# Patient Record
Sex: Male | Born: 1950
Health system: Southern US, Community
[De-identification: ages and names within clinical notes are randomized; demographics above are authoritative.]

## PROBLEM LIST (undated history)

## (undated) DIAGNOSIS — R7303 Prediabetes: Secondary | ICD-10-CM

## (undated) DIAGNOSIS — C439 Malignant melanoma of skin, unspecified: Secondary | ICD-10-CM

## (undated) DIAGNOSIS — N400 Enlarged prostate without lower urinary tract symptoms: Secondary | ICD-10-CM

## (undated) DIAGNOSIS — R972 Elevated prostate specific antigen [PSA]: Secondary | ICD-10-CM

## (undated) DIAGNOSIS — E785 Hyperlipidemia, unspecified: Secondary | ICD-10-CM

## (undated) DIAGNOSIS — K635 Polyp of colon: Secondary | ICD-10-CM

## (undated) HISTORY — DX: Prediabetes: R73.03

## (undated) HISTORY — DX: Benign prostatic hyperplasia without lower urinary tract symptoms: N40.0

## (undated) HISTORY — DX: Polyp of colon: K63.5

## (undated) HISTORY — DX: Hyperlipidemia, unspecified: E78.5

## (undated) HISTORY — PX: MELANOMA EXCISION: SHX5266

## (undated) HISTORY — DX: Malignant melanoma of skin, unspecified: C43.9

## (undated) HISTORY — DX: Elevated prostate specific antigen (PSA): R97.20

---

## 2016-07-17 LAB — HM COLONOSCOPY

## 2019-09-28 ENCOUNTER — Encounter: Payer: Self-pay | Admitting: Podiatry

## 2019-09-28 ENCOUNTER — Other Ambulatory Visit: Payer: Self-pay

## 2019-09-28 ENCOUNTER — Ambulatory Visit (INDEPENDENT_AMBULATORY_CARE_PROVIDER_SITE_OTHER): Payer: Medicare Other | Admitting: Podiatry

## 2019-09-28 ENCOUNTER — Ambulatory Visit (INDEPENDENT_AMBULATORY_CARE_PROVIDER_SITE_OTHER): Payer: Medicare Other

## 2019-09-28 DIAGNOSIS — M2012 Hallux valgus (acquired), left foot: Secondary | ICD-10-CM

## 2019-09-28 DIAGNOSIS — M779 Enthesopathy, unspecified: Secondary | ICD-10-CM

## 2019-09-28 DIAGNOSIS — M2011 Hallux valgus (acquired), right foot: Secondary | ICD-10-CM | POA: Diagnosis not present

## 2019-09-28 NOTE — Progress Notes (Signed)
Subjective:   Patient ID: Joel Stevens, male   DOB: 69 y.o.   MRN: [id number]   HPI Patient states he has a lot of enlargement of bone on the top of the left foot that gets sore in the joint itself seems to bother him and is not sure as to the problem.  States is been going on now for about a year and he tries to be very active and does not smoke   Review of Systems  All other systems reviewed and are negative.       Objective:  Physical Exam Vitals and nursing note reviewed.  Constitutional:      Appearance: He is well-developed.  Pulmonary:     Effort: Pulmonary effort is normal.  Musculoskeletal:        General: Normal range of motion.  Skin:    General: Skin is warm.  Neurological:     Mental Status: He is alert.     Neurovascular status found to be intact muscle strength was adequate range of motion was within normal limits.  Patient is found to have hyperostosis with enlargement around the first metatarsal head left with reduced range of motion of the joint with discomfort within the joint itself and on top of the bone spur.  Patient has good digital perfusion well oriented x3     Assessment:  Hallux limitus deformity left bone spur formation with probable beginnings of inflammation capsulitis with probable damage to the cartilage of the mild degree     Plan:  H&P condition reviewed today I did sterile prep and injected around the first MPJ 3 mg Kenalog 5 mg Xylocaine discussed shoe gear modifications and evaluation and explained to him the possibility for surgical intervention and what would be necessary in the future.  Patient will be seen back 1 month to see how he responds to medications to decide if bone spur resection or other procedure may be necessary  X-ray indicates large bone spur formation dorsal left first metatarsal with narrowing of the joint surface noted

## 2019-10-26 ENCOUNTER — Ambulatory Visit (INDEPENDENT_AMBULATORY_CARE_PROVIDER_SITE_OTHER): Payer: Medicare Other | Admitting: Podiatry

## 2019-10-26 ENCOUNTER — Other Ambulatory Visit: Payer: Self-pay

## 2019-10-26 ENCOUNTER — Encounter: Payer: Self-pay | Admitting: Podiatry

## 2019-10-26 DIAGNOSIS — M779 Enthesopathy, unspecified: Secondary | ICD-10-CM | POA: Diagnosis not present

## 2019-10-26 DIAGNOSIS — M2012 Hallux valgus (acquired), left foot: Secondary | ICD-10-CM

## 2019-10-26 DIAGNOSIS — M205X2 Other deformities of toe(s) (acquired), left foot: Secondary | ICD-10-CM | POA: Diagnosis not present

## 2019-10-26 DIAGNOSIS — M2011 Hallux valgus (acquired), right foot: Secondary | ICD-10-CM

## 2019-10-28 NOTE — Progress Notes (Signed)
Subjective:   Patient ID: Joel Stevens, male   DOB: 69 y.o.   MRN: [id number]   HPI Patient presents stating that the big toe joint has been doing much better and while he still has the spur and other problems he is very satisfied with where it is now   ROS      Objective:  Physical Exam  Neurovascular status intact with patient having hallux limitus deformity with chronic capsulitis and bone spur formation left first metatarsal     Assessment:  Hallux limitus with bone spur formation dorsal with inflamed joint surface left first MPJ improved at this time     Plan:  H&P performed and reviewed condition at great length.  Discussed different treatment options including surgical intervention bone spur removal oral anti-inflammatories or topical medications with shoe gear modifications.  He will use topical with shoe gear modifications currently and will be seen back as symptoms indicate and may need surgery in future that I educated him on today

## 2020-04-16 HISTORY — PX: PROSTATE BIOPSY: SHX241

## 2020-06-06 ENCOUNTER — Telehealth: Payer: Self-pay | Admitting: General Practice

## 2020-06-06 NOTE — Telephone Encounter (Signed)
lvm for patient to return call to get appointment scheduled from the online request

## 2020-06-08 ENCOUNTER — Encounter: Payer: Self-pay | Admitting: Podiatry

## 2020-07-04 ENCOUNTER — Ambulatory Visit: Payer: Medicare Other | Admitting: Family Medicine

## 2020-07-08 ENCOUNTER — Encounter: Payer: Self-pay | Admitting: Family Medicine

## 2020-07-08 ENCOUNTER — Other Ambulatory Visit: Payer: Self-pay

## 2020-07-08 ENCOUNTER — Ambulatory Visit: Payer: Medicare Other | Admitting: Family Medicine

## 2020-07-08 ENCOUNTER — Ambulatory Visit (INDEPENDENT_AMBULATORY_CARE_PROVIDER_SITE_OTHER): Payer: Medicare Other | Admitting: Family Medicine

## 2020-07-08 VITALS — BP 118/74 | HR 63 | Temp 98.1°F | Ht 66.5 in | Wt 204.6 lb

## 2020-07-08 DIAGNOSIS — R3915 Urgency of urination: Secondary | ICD-10-CM | POA: Diagnosis not present

## 2020-07-08 DIAGNOSIS — N401 Enlarged prostate with lower urinary tract symptoms: Secondary | ICD-10-CM | POA: Insufficient documentation

## 2020-07-08 DIAGNOSIS — R350 Frequency of micturition: Secondary | ICD-10-CM | POA: Diagnosis not present

## 2020-07-08 DIAGNOSIS — E785 Hyperlipidemia, unspecified: Secondary | ICD-10-CM | POA: Insufficient documentation

## 2020-07-08 DIAGNOSIS — N529 Male erectile dysfunction, unspecified: Secondary | ICD-10-CM | POA: Diagnosis not present

## 2020-07-08 DIAGNOSIS — R7303 Prediabetes: Secondary | ICD-10-CM | POA: Diagnosis not present

## 2020-07-08 DIAGNOSIS — Z800 Family history of malignant neoplasm of digestive organs: Secondary | ICD-10-CM

## 2020-07-08 DIAGNOSIS — R972 Elevated prostate specific antigen [PSA]: Secondary | ICD-10-CM | POA: Diagnosis not present

## 2020-07-08 DIAGNOSIS — C439 Malignant melanoma of skin, unspecified: Secondary | ICD-10-CM | POA: Insufficient documentation

## 2020-07-08 DIAGNOSIS — Z86010 Personal history of colonic polyps: Secondary | ICD-10-CM

## 2020-07-08 LAB — POCT URINALYSIS DIP (PROADVANTAGE DEVICE)
Bilirubin, UA: NEGATIVE
Blood, UA: NEGATIVE
Glucose, UA: NEGATIVE mg/dL
Ketones, POC UA: NEGATIVE mg/dL
Leukocytes, UA: NEGATIVE
Nitrite, UA: NEGATIVE
Protein Ur, POC: NEGATIVE mg/dL
Specific Gravity, Urine: 1.015
Urobilinogen, Ur: 0.2
pH, UA: 7.5 (ref 5.0–8.0)

## 2020-07-08 NOTE — Patient Instructions (Signed)
You will hear from [facility] and [facility].   We will be in touch with your lab results.    Dermatology offices  [facility]: Phone #: [phone] Address: [address], [place], [place] [address]  [facility]: Phone: [phone]  Address: [address] [place], [place] [address]  [facility]: [phone] Address: [address] [place], [place] [address]  [facility] Address: [place], [place], [place] [address] Phone: [phone]

## 2020-07-08 NOTE — Progress Notes (Signed)
   Subjective:    Patient ID: Joel Stevens, male    DOB: May 17, 1950, 70 y.o.   MRN: [id number]  HPI Chief Complaint  Patient presents with  . Establish Care   He is new to the practice and here to establish care.  Previous medical care: moved back here from [facility].    Other providers: Podiatrist- Dr. Paulla Dolly   Lost 75 lbs several years ago with diet and exercise.   HL and HTN under control.   6 month history of urinary frequency, urgency and reports a history of BPH and elevated PSA. He has not followed up with urologist since moving here from [facility].  No issues with bladder emptying.   Is not sexually active but reports having issues with ED.   Reports history of prediabetes. Would like to have this checked.   History of melanoma on chest and was supposed to follow up for skin checks q6 months but has not.   Overdue for colonoscopy due to colonic polyps. States his brother was diagnosed with colon cancer.   Denies fever, chills, dizziness, chest pain, palpitations, shortness of breath, abdominal pain, N/V/D, urinary symptoms, LE edema.    Social history: divorced, retired since 2013.  Denies smoking, drinking alcohol, drug use Diet: high in carbs. Not a lot of meat  Exercise: daily walks between 6-10 miles     Reviewed allergies, medications, past medical, surgical, family, and social history.    Review of Systems Pertinent positives and negatives in the history of present illness.     Objective:   Physical Exam BP 118/74   Pulse 63   Temp 98.1 F (36.7 C)   Ht 5' 6.5" (1.689 m)   Wt 204 lb 9.6 oz (92.8 kg)   SpO2 96%   BMI 32.53 kg/m   Alert and in no distress. Cardiac exam shows a regular sinus rhythm without murmurs or gallops. Lungs are clear to auscultation. Extremities without edema. Skin is warm and dry.        Assessment & Plan:  Benign prostatic hyperplasia with urinary frequency - Plan: POCT  Urinalysis DIP (Proadvantage Device), Ambulatory referral to Urology Urinalysis dipstick negative.  Check labs. -Referral to urology  Elevated PSA - Plan: PSA, Ambulatory referral to Urology -Recent established with [facility].  New to the area.  Urinary urgency - Plan: POCT Urinalysis DIP (Proadvantage Device), Ambulatory referral to Urology -UA negative.  Referral to urology  Erectile dysfunction, unspecified erectile dysfunction type - Plan: Ambulatory referral to Urology  Prediabetes - Plan: CBC with Differential/Platelet, Comprehensive metabolic panel, TSH, Hemoglobin A1c -Recommend low-carb, low sugar diet.  Follow-up pending A1c result  Hyperlipidemia, unspecified hyperlipidemia type - Plan: Lipid panel -He is not currently on a statin.  Follow-up pending lipid panel results   History of colon polyps - Plan: Ambulatory referral to Gastroenterology  Family history of colon cancer - Plan: Ambulatory referral to Gastroenterology  Malignant melanoma, unspecified site [facility]) -Discussed the importance of close follow-up with dermatology.  I provided him with several office recommendations

## 2020-07-09 LAB — HEMOGLOBIN A1C
Est. average glucose Bld gHb Est-mCnc: 123 mg/dL
Hgb A1c MFr Bld: 5.9 % — ABNORMAL HIGH (ref 4.8–5.6)

## 2020-07-09 LAB — LIPID PANEL
Chol/HDL Ratio: 5 ratio (ref 0.0–5.0)
Cholesterol, Total: 239 mg/dL — ABNORMAL HIGH (ref 100–199)
HDL: 48 mg/dL (ref >39)
LDL Chol Calc (NIH): 126 mg/dL — ABNORMAL HIGH (ref 0–99)
Triglycerides: 369 mg/dL — ABNORMAL HIGH (ref 0–149)
VLDL Cholesterol Cal: 65 mg/dL — ABNORMAL HIGH (ref 5–40)

## 2020-07-09 LAB — COMPREHENSIVE METABOLIC PANEL
ALT: 17 IU/L (ref 0–44)
AST: 24 IU/L (ref 0–40)
Albumin/Globulin Ratio: 1.9 (ref 1.2–2.2)
Albumin: 4.3 g/dL (ref 3.8–4.8)
Alkaline Phosphatase: 75 IU/L (ref 44–121)
BUN/Creatinine Ratio: 27 — ABNORMAL HIGH (ref 10–24)
BUN: 22 mg/dL (ref 8–27)
Bilirubin Total: 0.3 mg/dL (ref 0.0–1.2)
CO2: 23 mmol/L (ref 20–29)
Calcium: 9.1 mg/dL (ref 8.6–10.2)
Chloride: 100 mmol/L (ref 96–106)
Creatinine, Ser: 0.81 mg/dL (ref 0.76–1.27)
Globulin, Total: 2.3 g/dL (ref 1.5–4.5)
Glucose: 99 mg/dL (ref 65–99)
Potassium: 4.4 mmol/L (ref 3.5–5.2)
Sodium: 138 mmol/L (ref 134–144)
Total Protein: 6.6 g/dL (ref 6.0–8.5)
eGFR: 95 mL/min/{1.73_m2} (ref >59)

## 2020-07-09 LAB — CBC WITH DIFFERENTIAL/PLATELET
Basophils Absolute: 0.1 10*3/uL (ref 0.0–0.2)
Basos: 1 %
EOS (ABSOLUTE): 0.1 10*3/uL (ref 0.0–0.4)
Eos: 2 %
Hematocrit: 42.9 % (ref 37.5–51.0)
Hemoglobin: 14.6 g/dL (ref 13.0–17.7)
Immature Grans (Abs): 0 10*3/uL (ref 0.0–0.1)
Immature Granulocytes: 0 %
Lymphocytes Absolute: 1.4 10*3/uL (ref 0.7–3.1)
Lymphs: 21 %
MCH: 29.6 pg (ref 26.6–33.0)
MCHC: 34 g/dL (ref 31.5–35.7)
MCV: 87 fL (ref 79–97)
Monocytes Absolute: 0.6 10*3/uL (ref 0.1–0.9)
Monocytes: 9 %
Neutrophils Absolute: 4.6 10*3/uL (ref 1.4–7.0)
Neutrophils: 67 %
Platelets: 203 10*3/uL (ref 150–450)
RBC: 4.94 x10E6/uL (ref 4.14–5.80)
RDW: 13.1 % (ref 11.6–15.4)
WBC: 6.8 10*3/uL (ref 3.4–10.8)

## 2020-07-09 LAB — TSH: TSH: 2.52 u[IU]/mL (ref 0.450–4.500)

## 2020-07-09 LAB — PSA: Prostate Specific Ag, Serum: 9.1 ng/mL — ABNORMAL HIGH (ref 0.0–4.0)

## 2020-07-10 NOTE — Progress Notes (Signed)
Please check on the urology referral. I sent him a mychart message about his elevated PSA.

## 2020-07-20 ENCOUNTER — Telehealth: Payer: Self-pay | Admitting: Family Medicine

## 2020-07-20 NOTE — Telephone Encounter (Signed)
Requested records received from [facility]

## 2020-07-25 ENCOUNTER — Encounter: Payer: Self-pay | Admitting: Internal Medicine

## 2020-07-27 ENCOUNTER — Telehealth: Payer: Self-pay | Admitting: Family Medicine

## 2020-07-27 NOTE — Telephone Encounter (Signed)
Received requested records from [facility]

## 2020-08-11 ENCOUNTER — Encounter: Payer: Self-pay | Admitting: Family Medicine

## 2020-08-11 DIAGNOSIS — N401 Enlarged prostate with lower urinary tract symptoms: Secondary | ICD-10-CM | POA: Diagnosis not present

## 2020-08-11 DIAGNOSIS — N528 Other male erectile dysfunction: Secondary | ICD-10-CM | POA: Diagnosis not present

## 2020-08-11 DIAGNOSIS — R3915 Urgency of urination: Secondary | ICD-10-CM | POA: Diagnosis not present

## 2020-08-11 DIAGNOSIS — R972 Elevated prostate specific antigen [PSA]: Secondary | ICD-10-CM | POA: Diagnosis not present

## 2020-08-16 ENCOUNTER — Other Ambulatory Visit: Payer: Self-pay | Admitting: Urology

## 2020-08-16 DIAGNOSIS — R972 Elevated prostate specific antigen [PSA]: Secondary | ICD-10-CM

## 2020-08-16 DIAGNOSIS — Z8042 Family history of malignant neoplasm of prostate: Secondary | ICD-10-CM

## 2020-09-01 ENCOUNTER — Ambulatory Visit
Admission: RE | Admit: 2020-09-01 | Discharge: 2020-09-01 | Disposition: A | Discharge status: Home / Self Care | Payer: Medicare Other | Source: Ambulatory Visit | Attending: Urology | Admitting: Urology

## 2020-09-01 DIAGNOSIS — R972 Elevated prostate specific antigen [PSA]: Secondary | ICD-10-CM

## 2020-09-01 DIAGNOSIS — Z8042 Family history of malignant neoplasm of prostate: Secondary | ICD-10-CM

## 2020-09-21 NOTE — Progress Notes (Deleted)
Joel Stevens is a 70 y.o. male who presents for annual wellness visit and follow-up on chronic medical conditions.  He has the following concerns:   Immunization History  Administered Date(s) Administered  . Influenza-Unspecified 05/03/2020  . PFIZER(Purple Top)SARS-COV-2 Vaccination 06/08/2019, 06/29/2019, 01/19/2020   Last colonoscopy: Last PSA: Dentist: Ophtho: Exercise:  Other doctors caring for patient include:   Depression screen:  See questionnaire below.     Depression screen [facility] 2/9 07/08/2020  Decreased Interest 0  Down, Depressed, Hopeless 0  PHQ - 2 Score 0    Fall Screen: See Questionaire below.  No flowsheet data found.  ADL screen:  See questionnaire below.  Functional Status Survey:     End of Life Discussion:  Patient {ACTIONS; HAS/DOES NOT HAVE:19233} a living will and medical power of attorney   Review of Systems  Constitutional: -fever, -chills, -sweats, -unexpected weight change, -anorexia, -fatigue Allergy: -sneezing, -itching, -congestion Dermatology: denies changing moles, rash, lumps, new worrisome lesions ENT: -runny nose, -ear pain, -sore throat, -hoarseness, -sinus pain, -teeth pain, -tinnitus, -hearing loss, -epistaxis Cardiology:  -chest pain, -palpitations, -edema, -orthopnea, -paroxysmal nocturnal dyspnea Respiratory: -cough, -shortness of breath, -dyspnea on exertion, -wheezing, -hemoptysis Gastroenterology: -abdominal pain, -nausea, -vomiting, -diarrhea, -constipation, -blood in stool, -changes in bowel movement, -dysphagia Hematology: -bleeding or bruising problems Musculoskeletal: -arthralgias, -myalgias, -joint swelling, -back pain, -neck pain, -cramping, -gait changes Ophthalmology: -vision changes, -eye redness, -itching, -discharge Urology: -dysuria, -difficulty urinating, -hematuria, -urinary frequency, -urgency, incontinence Neurology: -headache, -weakness, -tingling, -numbness, -speech abnormality, -memory loss, -falls,  -dizziness Psychology:  -depressed mood, -agitation, -sleep problems   PHYSICAL EXAM:  There were no vitals taken for this visit.  General Appearance: Alert, cooperative, no distress, appears stated age Head: Normocephalic, without obvious abnormality, atraumatic Eyes: PERRL, conjunctiva/corneas clear, EOM's intact, fundi benign Ears: Normal TM's and external ear canals Nose: Nares normal, mucosa normal, no drainage or sinus   tenderness Throat: Lips, mucosa, and tongue normal; teeth and gums normal Neck: Supple, no lymphadenopathy, thyroid:no enlargement/tenderness/nodules; no carotid bruit or JVD Back: Spine nontender, no curvature, ROM normal, no CVA tenderness Lungs: Clear to auscultation bilaterally without wheezes, rales or ronchi; respirations unlabored Chest Wall: No tenderness or deformity Heart: Regular rate and rhythm, S1 and S2 normal, no murmur, rub or gallop Breast Exam: No chest wall tenderness, masses or gynecomastia Abdomen: Soft, non-tender, nondistended, normoactive bowel sounds, no masses, no hepatosplenomegaly Genitalia: Normal male external genitalia without lesions.  Testicles without masses.  No inguinal hernias. Rectal: Normal sphincter tone, no masses or tenderness; guaiac negative stool.  Prostate smooth, no nodules, not enlarged. Extremities: No clubbing, cyanosis or edema Pulses: 2+ and symmetric all extremities Skin: Skin color, texture, turgor normal, no rashes or lesions Lymph nodes: Cervical, supraclavicular, and axillary nodes normal Neurologic: CNII-XII intact, normal strength, sensation and gait; reflexes 2+ and symmetric throughout   Psych: Normal mood, affect, hygiene and grooming  ASSESSMENT/PLAN: Medicare annual wellness visit, subsequent     Discussed PSA screening (risks/benefits), recommended at least 30 minutes of aerobic activity at least 5 days/week; proper sunscreen use reviewed; healthy diet and alcohol recommendations (less than or  equal to 2 drinks/day) reviewed; regular seatbelt use; changing batteries in smoke detectors. Immunization recommendations discussed.  Colonoscopy recommendations reviewed.   Medicare Attestation I have personally reviewed: The patient's medical and social history Their use of alcohol, tobacco or illicit drugs Their current medications and supplements The patient's functional ability including ADLs,fall risks, home safety risks, cognitive, and hearing and visual impairment Diet and  physical activities Evidence for depression or mood disorders  The patient's weight, height, and BMI have been recorded in the chart.  I have made referrals, counseling, and provided education to the patient based on review of the above and I have provided the patient with a written personalized care plan for preventive services.     Harland Dingwall, NP-C   09/21/2020

## 2020-09-22 ENCOUNTER — Ambulatory Visit: Payer: Medicare Other | Admitting: Family Medicine

## 2020-09-30 ENCOUNTER — Encounter: Payer: Self-pay | Admitting: Family Medicine

## 2020-10-06 DIAGNOSIS — R972 Elevated prostate specific antigen [PSA]: Secondary | ICD-10-CM | POA: Diagnosis not present

## 2020-10-12 ENCOUNTER — Encounter: Payer: Self-pay | Admitting: Internal Medicine

## 2020-10-12 DIAGNOSIS — R972 Elevated prostate specific antigen [PSA]: Secondary | ICD-10-CM | POA: Diagnosis not present

## 2020-10-13 DIAGNOSIS — R972 Elevated prostate specific antigen [PSA]: Secondary | ICD-10-CM | POA: Diagnosis not present

## 2020-11-02 ENCOUNTER — Ambulatory Visit: Payer: Medicare Other | Admitting: Registered Nurse

## 2020-11-14 ENCOUNTER — Ambulatory Visit: Payer: Medicare Other | Admitting: Family Medicine

## 2021-01-04 ENCOUNTER — Encounter: Payer: Self-pay | Admitting: Registered Nurse

## 2021-01-04 ENCOUNTER — Other Ambulatory Visit: Payer: Self-pay

## 2021-01-04 ENCOUNTER — Ambulatory Visit (INDEPENDENT_AMBULATORY_CARE_PROVIDER_SITE_OTHER): Payer: Medicare Other | Admitting: Registered Nurse

## 2021-01-04 VITALS — BP 125/109 | HR 66 | Temp 98.3°F | Resp 18 | Ht 69.0 in | Wt 204.2 lb

## 2021-01-04 DIAGNOSIS — Z85820 Personal history of malignant melanoma of skin: Secondary | ICD-10-CM | POA: Diagnosis not present

## 2021-01-04 DIAGNOSIS — Z0000 Encounter for general adult medical examination without abnormal findings: Secondary | ICD-10-CM

## 2021-01-04 DIAGNOSIS — Z23 Encounter for immunization: Secondary | ICD-10-CM

## 2021-01-04 DIAGNOSIS — E669 Obesity, unspecified: Secondary | ICD-10-CM

## 2021-01-04 DIAGNOSIS — Z125 Encounter for screening for malignant neoplasm of prostate: Secondary | ICD-10-CM | POA: Diagnosis not present

## 2021-01-04 DIAGNOSIS — E785 Hyperlipidemia, unspecified: Secondary | ICD-10-CM | POA: Diagnosis not present

## 2021-01-04 DIAGNOSIS — R7303 Prediabetes: Secondary | ICD-10-CM

## 2021-01-04 DIAGNOSIS — Z1159 Encounter for screening for other viral diseases: Secondary | ICD-10-CM | POA: Diagnosis not present

## 2021-01-04 DIAGNOSIS — R972 Elevated prostate specific antigen [PSA]: Secondary | ICD-10-CM

## 2021-01-04 DIAGNOSIS — R41840 Attention and concentration deficit: Secondary | ICD-10-CM

## 2021-01-04 DIAGNOSIS — Z6830 Body mass index (BMI) 30.0-30.9, adult: Secondary | ICD-10-CM

## 2021-01-04 LAB — COMPREHENSIVE METABOLIC PANEL
ALT: 15 U/L (ref 0–53)
AST: 21 U/L (ref 0–37)
Albumin: 4.5 g/dL (ref 3.5–5.2)
Alkaline Phosphatase: 60 U/L (ref 39–117)
BUN: 18 mg/dL (ref 6–23)
CO2: 32 mEq/L (ref 19–32)
Calcium: 9.3 mg/dL (ref 8.4–10.5)
Chloride: 101 mEq/L (ref 96–112)
Creatinine, Ser: 0.83 mg/dL (ref 0.40–1.50)
GFR: 89.04 mL/min (ref >60.00)
Glucose, Bld: 95 mg/dL (ref 70–99)
Potassium: 4.1 mEq/L (ref 3.5–5.1)
Sodium: 139 mEq/L (ref 135–145)
Total Bilirubin: 0.5 mg/dL (ref 0.2–1.2)
Total Protein: 7.2 g/dL (ref 6.0–8.3)

## 2021-01-04 LAB — PSA, MEDICARE: PSA: 7.16 ng/ml — ABNORMAL HIGH (ref 0.10–4.00)

## 2021-01-04 LAB — LIPID PANEL
Cholesterol: 217 mg/dL — ABNORMAL HIGH (ref 0–200)
HDL: 63.3 mg/dL (ref >39.00)
LDL Cholesterol: 123 mg/dL — ABNORMAL HIGH (ref 0–99)
NonHDL: 153.67
Total CHOL/HDL Ratio: 3
Triglycerides: 155 mg/dL — ABNORMAL HIGH (ref 0.0–149.0)
VLDL: 31 mg/dL (ref 0.0–40.0)

## 2021-01-04 LAB — HEMOGLOBIN A1C: Hgb A1c MFr Bld: 6.1 % (ref 4.6–6.5)

## 2021-01-04 MED ORDER — PHENTERMINE HCL 37.5 MG PO CAPS: 37.5000 mg | ORAL_CAPSULE | ORAL | 1 refills | Status: DC

## 2021-01-04 NOTE — Patient Instructions (Addendum)
Mr. Joel Stevens -   Doristine Devoid to meet you.  Labs will be back this afternoon  Give me a call if you need anything  See you in 6 mo  Thank you  Rich     If you have lab work done today you will be contacted with your lab results within the next 2 weeks.  If you have not heard from [place] then please contact us. The fastest way to get your results is to register for My Chart.   IF you received an x-ray today, you will receive an invoice from [facility]. Please contact [facility] at [phone] with questions or concerns regarding your invoice.   IF you received labwork today, you will receive an invoice from [facility]. Please contact [facility] at [phone] with questions or concerns regarding your invoice.   Our billing staff will not be able to assist you with questions regarding bills from these companies.  You will be contacted with the lab results as soon as they are available. The fastest way to get your results is to activate your My Chart account. Instructions are located on the last page of this paperwork. If you have not heard from [place] regarding the results in 2 weeks, please contact this office.

## 2021-01-05 ENCOUNTER — Telehealth: Payer: Self-pay

## 2021-01-05 ENCOUNTER — Ambulatory Visit: Payer: Medicare Other | Admitting: Registered Nurse

## 2021-01-05 LAB — CBC WITH DIFFERENTIAL/PLATELET
Basophils Absolute: 0.1 10*3/uL (ref 0.0–0.1)
Basophils Relative: 1.2 % (ref 0.0–3.0)
Eosinophils Absolute: 0.1 10*3/uL (ref 0.0–0.7)
Eosinophils Relative: 2.4 % (ref 0.0–5.0)
HCT: 45.1 % (ref 39.0–52.0)
Hemoglobin: 15.1 g/dL (ref 13.0–17.0)
Lymphocytes Relative: 21.6 % (ref 12.0–46.0)
Lymphs Abs: 1.2 10*3/uL (ref 0.7–4.0)
MCHC: 33.6 g/dL (ref 30.0–36.0)
MCV: 88.3 fl (ref 78.0–100.0)
Monocytes Absolute: 0.6 10*3/uL (ref 0.1–1.0)
Monocytes Relative: 11.2 % (ref 3.0–12.0)
Neutro Abs: 3.4 10*3/uL (ref 1.4–7.7)
Neutrophils Relative %: 63.6 % (ref 43.0–77.0)
Platelets: 191 10*3/uL (ref 150.0–400.0)
RBC: 5.1 Mil/uL (ref 4.22–5.81)
RDW: 14.1 % (ref 11.5–15.5)
WBC: 5.4 10*3/uL (ref 4.0–10.5)

## 2021-01-05 LAB — HEPATITIS C ANTIBODY
Hepatitis C Ab: NONREACTIVE
SIGNAL TO CUT-OFF: 0.01 (ref <1.00)

## 2021-01-05 NOTE — Telephone Encounter (Signed)
Attempted to call patient to let him know he does not need an appointment for the referral to be sent because he was in office yesterday

## 2021-01-06 NOTE — Progress Notes (Addendum)
New Patient Office Visit  Subjective:  Patient ID: Joel Stevens, male    DOB: [birth year]  Age: 70 y.o. MRN: [id number]  CC:  Chief Complaint  Patient presents with   Annual Exam    Patient states he is here for a Physical . Patient would like to discuss colonoscopy and shingles vaccine    HPI Evern Core Lottes presents for visit to est care   Histories reviewed and updated with patient.   Bph Hx of elevated psa S/p prostate biopsy with normal findings Asymptomatic No concerns.  Colon polyps Not yet due for follow up screen No symptoms  Hyperlipidemia and prediabetes Diet and exercise controlled No acute concerns Would like routine labs.  Hx of melanoma Has been some time since seeing derm Would like referral for follow up No distinct lesions of concern at this time.  Obesity with BMI 30 Has been on phentermine in past with good effect Notes that it helps him focus, unsure the extent to which it has helped any potential underlying ADD/ADHD. Never had formal work up for this.  He has consistently lost weight - at one point had been 260+ lbs, but with phentermine, diet, and exercise, has had much healthier results.   Otherwise no concerns. Feeling well overall.   Past Medical History:  Diagnosis Date   BPH (benign prostatic hyperplasia)    Colon polyps    Elevated PSA    Hyperlipidemia    Melanoma ([place])    Prediabetes     Past Surgical History:  Procedure Laterality Date   MELANOMA EXCISION     PROSTATE BIOPSY  2022   negative    Family History  Problem Relation Age of Onset   Colon cancer Brother     Social History   Socioeconomic History   Marital status: Divorced    Spouse name: Not on file   Number of children: Not on file   Years of education: Not on file   Highest education level: Not on file  Occupational History   Not on file  Tobacco Use   Smoking status: Never   Smokeless tobacco: Never  Vaping Use   Vaping Use: Never  used  Substance and Sexual Activity   Alcohol use: Not Currently   Drug use: Never   Sexual activity: Not Currently  Other Topics Concern   Not on file  Social History Narrative   Not on file   Social Determinants of Health   Financial Resource Strain: Not on file  Food Insecurity: Not on file  Transportation Needs: Not on file  Physical Activity: Not on file  Stress: Not on file  Social Connections: Not on file  Intimate Partner Violence: Not on file    ROS Review of Systems  Constitutional: Negative.   HENT: Negative.    Eyes: Negative.   Respiratory: Negative.    Cardiovascular: Negative.   Gastrointestinal: Negative.   Genitourinary: Negative.   Musculoskeletal: Negative.   Skin: Negative.   Neurological: Negative.   Psychiatric/Behavioral: Negative.    All other systems reviewed and are negative.  Objective:   Today's Vitals: BP (!) 125/109   Pulse 66   Temp 98.3 F (36.8 C) (Temporal)   Resp 18   Ht 5\' 9"  (1.753 m)   Wt 204 lb 3.2 oz (92.6 kg)   SpO2 99%   BMI 30.16 kg/m   Physical Exam Vitals and nursing note reviewed.  Constitutional:      General: He is not in  acute distress.    Appearance: Normal appearance. He is obese. He is not ill-appearing, toxic-appearing or diaphoretic.  HENT:     Head: Normocephalic and atraumatic.     Right Ear: Tympanic membrane, ear canal and external ear normal. There is no impacted cerumen.     Left Ear: Tympanic membrane, ear canal and external ear normal. There is no impacted cerumen.     Nose: Nose normal. No congestion or rhinorrhea.     Mouth/Throat:     Mouth: Mucous membranes are moist.     Pharynx: Oropharynx is clear. No oropharyngeal exudate or posterior oropharyngeal erythema.  Eyes:     General: No scleral icterus.       Right eye: No discharge.        Left eye: No discharge.     Extraocular Movements: Extraocular movements intact.     Conjunctiva/sclera: Conjunctivae normal.     Pupils: Pupils are  equal, round, and reactive to light.  Neck:     Vascular: No carotid bruit.  Cardiovascular:     Rate and Rhythm: Normal rate and regular rhythm.     Pulses: Normal pulses.     Heart sounds: Normal heart sounds. No murmur heard.   No friction rub. No gallop.  Pulmonary:     Effort: Pulmonary effort is normal. No respiratory distress.     Breath sounds: Normal breath sounds. No stridor. No wheezing, rhonchi or rales.  Chest:     Chest wall: No tenderness.  Abdominal:     General: Abdomen is flat. Bowel sounds are normal. There is no distension.     Palpations: Abdomen is soft. There is no mass.     Tenderness: There is no abdominal tenderness. There is no right CVA tenderness, left CVA tenderness, guarding or rebound.     Hernia: No hernia is present.  Musculoskeletal:        General: No swelling, tenderness, deformity or signs of injury. Normal range of motion.     Cervical back: Normal range of motion and neck supple. No rigidity or tenderness.     Right lower leg: No edema.     Left lower leg: No edema.  Lymphadenopathy:     Cervical: No cervical adenopathy.  Skin:    General: Skin is warm and dry.     Capillary Refill: Capillary refill takes less than 2 seconds.     Coloration: Skin is not jaundiced or pale.     Findings: No bruising, erythema, lesion or rash.  Neurological:     General: No focal deficit present.     Mental Status: He is alert and oriented to person, place, and time. Mental status is at baseline.     Cranial Nerves: No cranial nerve deficit.     Sensory: No sensory deficit.     Motor: No weakness.     Coordination: Coordination normal.     Gait: Gait normal.     Deep Tendon Reflexes: Reflexes normal.  Psychiatric:        Mood and Affect: Mood normal.        Behavior: Behavior normal.        Thought Content: Thought content normal.        Judgment: Judgment normal.    Assessment & Plan:   Problem List Items Addressed This Visit       Other    Hyperlipidemia   Relevant Orders   CBC with Differential/Platelet (Completed)   Comprehensive metabolic panel (Completed)  Lipid panel (Completed)   Prediabetes   Relevant Orders   CBC with Differential/Platelet (Completed)   Comprehensive metabolic panel (Completed)   Hemoglobin A1c (Completed)   Elevated PSA   Other Visit Diagnoses     History of melanoma    -  Primary   Relevant Orders   Ambulatory referral to Dermatology   Obesity, Class I, BMI 30.0-34.9 (see actual BMI)       BMI 30.0-30.9,adult       Relevant Medications   phentermine 37.5 MG capsule   Flu vaccine need       Relevant Orders   Flu Vaccine QUAD High Dose(Fluad) (Completed)   Screening for viral disease       Relevant Orders   Hepatitis C Antibody (Completed)   Screening PSA (prostate specific antigen)       Relevant Orders   PSA, Medicare ( [facility] only) (Completed)   Concentration deficit           Outpatient Encounter Medications as of 01/04/2021  Medication Sig   Multiple Vitamin (MULTIVITAMIN) tablet Take 1 tablet by mouth daily.   phentermine 37.5 MG capsule Take 1 capsule (37.5 mg total) by mouth every morning.   finasteride (PROSCAR) 5 MG tablet Take 5 mg by mouth daily.   No facility-administered encounter medications on file as of 01/04/2021.    Follow-up: Return in about 6 months (around 07/04/2021) for hld, psa.   PLAN Labs collected. Will follow up with the patient as warranted. Restart phentermine for obesity class 1. Reviewed risks, benefits, and alternatives. Pt voices understanding. Given information for [facility]. He will seek assessment for ADD/ADHD. Refer to dermatology for hx of melanoma. No concerning lesions today but would be wise to have routine follow up with specialist. Follow up in 6 mo for check on chronic conditions, sooner with concerns Patient encouraged to call clinic with any questions, comments, or concerns.  I spent 45 minutes with  this patient discussing health maintenance, healthy lifestyle, high risk medication use, coordinating referrals, and discussing past and future lab studies.  Maximiano Coss, NP

## 2021-02-07 DIAGNOSIS — Z85820 Personal history of malignant melanoma of skin: Secondary | ICD-10-CM | POA: Diagnosis not present

## 2021-02-07 DIAGNOSIS — D229 Melanocytic nevi, unspecified: Secondary | ICD-10-CM | POA: Diagnosis not present

## 2021-02-07 DIAGNOSIS — Z1283 Encounter for screening for malignant neoplasm of skin: Secondary | ICD-10-CM | POA: Diagnosis not present

## 2021-02-07 DIAGNOSIS — Z08 Encounter for follow-up examination after completed treatment for malignant neoplasm: Secondary | ICD-10-CM | POA: Diagnosis not present

## 2021-02-07 DIAGNOSIS — D225 Melanocytic nevi of trunk: Secondary | ICD-10-CM | POA: Diagnosis not present

## 2021-04-11 ENCOUNTER — Telehealth (INDEPENDENT_AMBULATORY_CARE_PROVIDER_SITE_OTHER): Payer: Medicare Other | Admitting: Registered Nurse

## 2021-04-11 ENCOUNTER — Other Ambulatory Visit: Payer: Self-pay

## 2021-04-11 ENCOUNTER — Encounter: Payer: Self-pay | Admitting: Registered Nurse

## 2021-04-11 DIAGNOSIS — J069 Acute upper respiratory infection, unspecified: Secondary | ICD-10-CM | POA: Diagnosis not present

## 2021-04-11 NOTE — Patient Instructions (Signed)
° ° ° °  If you have lab work done today you will be contacted with your lab results within the next 2 weeks.  If you have not heard from us then please contact us. The fastest way to get your results is to register for My Chart. ° ° °IF you received an x-ray today, you will receive an invoice from [facility]. Please contact [facility] at [phone] with questions or concerns regarding your invoice.  ° °IF you received labwork today, you will receive an invoice from [facility]. Please contact [facility] at [phone] with questions or concerns regarding your invoice.  ° °Our billing staff will not be able to assist you with questions regarding bills from these companies. ° °You will be contacted with the lab results as soon as they are available. The fastest way to get your results is to activate your My Chart account. Instructions are located on the last page of this paperwork. If you have not heard from us regarding the results in 2 weeks, please contact this office. °  ° ° ° °

## 2021-04-11 NOTE — Progress Notes (Signed)
Telemedicine Encounter- SOAP NOTE Established Patient  This telephone encounter was conducted with the patient's (or proxy's) verbal consent via audio telecommunications: yes/no: Yes Patient was instructed to have this encounter in a suitably private space; and to only have persons present to whom they give permission to participate. In addition, patient identity was confirmed by use of name plus two identifiers (DOB and address).  I discussed the limitations, risks, security and privacy concerns of performing an evaluation and management service by telephone and the availability of in person appointments. I also discussed with the patient that there may be a patient responsible charge related to this service. The patient expressed understanding and agreed to proceed.  I spent a total of 14 minutes talking with the patient or their proxy.  Patient at home Provider in office  Participants: Kathrin Ruddy, NP and Gregary Signs  Chief Complaint  Patient presents with   Sore Throat    Patient is experiencing a Sore throat, coughing up phelgm, headaches, chest tightness, fatigue for about one week. Patient believes he has covid or the flu because he is feeling so bad. He is currently taking mucinex     Subjective   Joel Stevens is a 70 y.o. established patient. Telephone visit today for sore throat   HPI Onset one week ago Flu like symptoms Symptoms wax and wane.  Denies shob, doe, chest pain, palpitations, nvd  Occ lightheadedness, previously fever and chills but since resolved.  Patient Active Problem List   Diagnosis Date Noted   History of colon polyps 07/08/2020   Hyperlipidemia 07/08/2020   Prediabetes 07/08/2020   Erectile dysfunction 07/08/2020   Urinary urgency 07/08/2020   Elevated PSA 07/08/2020   Benign prostatic hyperplasia with urinary frequency 07/08/2020   Family history of colon cancer 07/08/2020   Melanoma ([place])     Past Medical History:   Diagnosis Date   BPH (benign prostatic hyperplasia)    Colon polyps    Elevated PSA    Hyperlipidemia    Melanoma ([place])    Prediabetes     Current Outpatient Medications  Medication Sig Dispense Refill   doxycycline (VIBRA-TABS) 100 MG tablet Take 1 tablet (100 mg total) by mouth 2 (two) times daily. 20 tablet 0   finasteride (PROSCAR) 5 MG tablet Take 5 mg by mouth daily.     fluticasone (FLONASE) 50 MCG/ACT nasal spray Place 2 sprays into both nostrils daily. 16 g 6   Multiple Vitamin (MULTIVITAMIN) tablet Take 1 tablet by mouth daily.     phentermine 37.5 MG capsule Take 1 capsule (37.5 mg total) by mouth every morning. 90 capsule 1   predniSONE (STERAPRED UNI-PAK 21 TAB) 10 MG (21) TBPK tablet Take per package instructions. Do not skip doses. Finish entire supply. 1 each 0   No current facility-administered medications for this visit.    No Known Allergies  Social History   Socioeconomic History   Marital status: Divorced    Spouse name: Not on file   Number of children: Not on file   Years of education: Not on file   Highest education level: Not on file  Occupational History   Not on file  Tobacco Use   Smoking status: Never   Smokeless tobacco: Never  Vaping Use   Vaping Use: Never used  Substance and Sexual Activity   Alcohol use: Not Currently   Drug use: Never   Sexual activity: Not Currently  Other Topics Concern   Not on  file  Social History Narrative   Not on file   Social Determinants of Health   Financial Resource Strain: Not on file  Food Insecurity: Not on file  Transportation Needs: Not on file  Physical Activity: Not on file  Stress: Not on file  Social Connections: Not on file  Intimate Partner Violence: Not on file    ROS Per hpi   Objective   Vitals as reported by the patient: There were no vitals filed for this visit.  Ayham was seen today for sore throat.  Diagnoses and all orders for this visit:  Upper respiratory tract  infection, unspecified type -     doxycycline (VIBRA-TABS) 100 MG tablet; Take 1 tablet (100 mg total) by mouth 2 (two) times daily. -     predniSONE (STERAPRED UNI-PAK 21 TAB) 10 MG (21) TBPK tablet; Take per package instructions. Do not skip doses. Finish entire supply. -     fluticasone (FLONASE) 50 MCG/ACT nasal spray; Place 2 sprays into both nostrils daily.    PLAN Treatment as above with doxy and prednisone Fluticasone for support Hydrate and rest Return if worsening or failing to improve Patient encouraged to call clinic with any questions, comments, or concerns.  I discussed the assessment and treatment plan with the patient. The patient was provided an opportunity to ask questions and all were answered. The patient agreed with the plan and demonstrated an understanding of the instructions.   The patient was advised to call back or seek an in-person evaluation if the symptoms worsen or if the condition fails to improve as anticipated.  I provided 14 minutes of non-face-to-face time during this encounter.  Maximiano Coss, NP

## 2021-04-12 ENCOUNTER — Encounter: Payer: Self-pay | Admitting: Registered Nurse

## 2021-04-12 MED ORDER — FLUTICASONE PROPIONATE 50 MCG/ACT NA SUSP: 2.0000 | Freq: Every day | NASAL | 6 refills | Status: DC

## 2021-04-12 MED ORDER — PREDNISONE 10 MG (21) PO TBPK: ORAL_TABLET | ORAL | 0 refills | Status: DC

## 2021-04-12 MED ORDER — DOXYCYCLINE HYCLATE 100 MG PO TABS: 100.0000 mg | ORAL_TABLET | Freq: Two times a day (BID) | ORAL | 0 refills | Status: DC

## 2021-04-12 NOTE — Telephone Encounter (Signed)
Pt called about the medications, when I told him I was going to send this the clinical pool that Joel Stevens has been in clinic all morning he hung up on me.   Please advise

## 2021-04-13 ENCOUNTER — Ambulatory Visit: Payer: Medicare Other | Admitting: Podiatry

## 2021-04-19 ENCOUNTER — Ambulatory Visit (INDEPENDENT_AMBULATORY_CARE_PROVIDER_SITE_OTHER): Payer: No Typology Code available for payment source | Admitting: Podiatry

## 2021-04-19 ENCOUNTER — Ambulatory Visit (INDEPENDENT_AMBULATORY_CARE_PROVIDER_SITE_OTHER): Payer: Self-pay

## 2021-04-19 ENCOUNTER — Ambulatory Visit (INDEPENDENT_AMBULATORY_CARE_PROVIDER_SITE_OTHER): Payer: No Typology Code available for payment source

## 2021-04-19 ENCOUNTER — Telehealth: Payer: Self-pay | Admitting: Urology

## 2021-04-19 ENCOUNTER — Other Ambulatory Visit: Payer: Self-pay

## 2021-04-19 DIAGNOSIS — M2011 Hallux valgus (acquired), right foot: Secondary | ICD-10-CM | POA: Diagnosis not present

## 2021-04-19 DIAGNOSIS — M2012 Hallux valgus (acquired), left foot: Secondary | ICD-10-CM

## 2021-04-19 DIAGNOSIS — M205X2 Other deformities of toe(s) (acquired), left foot: Secondary | ICD-10-CM

## 2021-04-19 NOTE — Telephone Encounter (Signed)
DOS - 04/25/21  MCBRIDE BUNIONECTOMY LEFT --- 38177  DEVOTED HEALTH PLANS EFFECTIVE DATE - 04/16/21  SPOKE WITH GAR T. WITH DEV[employer] PLANS AND HE STATED THAT FOR CPT CODE 11657 NO PRIOR AUTH IS REQUIRED.  REF # [place] T. 04/19/21 AT 1:58 EST

## 2021-04-19 NOTE — Progress Notes (Signed)
Subjective:   Patient ID: Joel Stevens, male   DOB: 71 y.o.   MRN: [id number]   HPI Patient states continued to have problems with big toe joint right and cannot wear shoe gear with degree of comfort.  Has tried shoe gear modification and soaks padding without relief and we have tried previous injection 6 months ago without relief   ROS      Objective:  Physical Exam  Neurovascular status intact continued spurring with prominence around the first metatarsal head left foot mild reduction of motion no crepitus of the joint      Assessment:  Enlargement of bone spur first metatarsal left that is impeding motion and creating pressure on the joint surface with shoe gear pressure     Plan:  H&P reviewed condition recommended resection of spur smoothing of joint possible osteochondral bone drilling and reviewed procedure risk and patient after extensive review signed consent form.  Understands no guarantee of success understands ultimately could require fusion joint implantation procedure and is willing to proceed and is scheduled for outpatient surgery after all questions answered.  Patient is encouraged to call questions concerns  X-rays indicate increase in the size of the spur formation first metatarsal head left no indications of complete loss of joint cartilage

## 2021-04-24 MED ORDER — HYDROCODONE-ACETAMINOPHEN 10-325 MG PO TABS: 1.0000 | ORAL_TABLET | Freq: Three times a day (TID) | ORAL | 0 refills | Status: AC | PRN

## 2021-04-24 NOTE — Addendum Note (Signed)
Addended by: Wallene Huh on: 04/24/2021 12:09 PM   Modules accepted: Orders

## 2021-04-25 ENCOUNTER — Encounter: Payer: Self-pay | Admitting: Podiatry

## 2021-04-25 DIAGNOSIS — M2022 Hallux rigidus, left foot: Secondary | ICD-10-CM | POA: Diagnosis not present

## 2021-04-25 DIAGNOSIS — M2012 Hallux valgus (acquired), left foot: Secondary | ICD-10-CM | POA: Diagnosis not present

## 2021-04-25 DIAGNOSIS — M2011 Hallux valgus (acquired), right foot: Secondary | ICD-10-CM | POA: Diagnosis not present

## 2021-05-01 ENCOUNTER — Ambulatory Visit (INDEPENDENT_AMBULATORY_CARE_PROVIDER_SITE_OTHER): Payer: No Typology Code available for payment source | Admitting: Podiatry

## 2021-05-01 ENCOUNTER — Encounter: Payer: Self-pay | Admitting: Podiatry

## 2021-05-01 ENCOUNTER — Other Ambulatory Visit: Payer: Self-pay

## 2021-05-01 ENCOUNTER — Ambulatory Visit (INDEPENDENT_AMBULATORY_CARE_PROVIDER_SITE_OTHER): Payer: No Typology Code available for payment source

## 2021-05-01 DIAGNOSIS — M205X2 Other deformities of toe(s) (acquired), left foot: Secondary | ICD-10-CM

## 2021-05-01 DIAGNOSIS — M2012 Hallux valgus (acquired), left foot: Secondary | ICD-10-CM

## 2021-05-01 DIAGNOSIS — M2011 Hallux valgus (acquired), right foot: Secondary | ICD-10-CM | POA: Diagnosis not present

## 2021-05-01 NOTE — Progress Notes (Signed)
°  Subjective:  Patient ID: Joel Stevens, male    DOB: [birth year],  MRN: [id number]  Chief Complaint  Patient presents with   Post-op Follow-up    Pt is here to follow up POV1 . Pt reports tight feeling on foot. No n/v/f/ not taking medication     DOS: 04/25/21  Procedure:Left foot Mcbride Bunionectomy.    71 y.o. male returns for POV#1. Doing well not having too much pain. Getting around well in surgical shoe   Review of Systems: Negative except as noted in the HPI. Denies N/V/F/Ch.  Past Medical History:  Diagnosis Date   BPH (benign prostatic hyperplasia)    Colon polyps    Elevated PSA    Hyperlipidemia    Melanoma ([place])    Prediabetes     Current Outpatient Medications:    doxycycline (VIBRA-TABS) 100 MG tablet, Take 1 tablet (100 mg total) by mouth 2 (two) times daily., Disp: 20 tablet, Rfl: 0   finasteride (PROSCAR) 5 MG tablet, Take 5 mg by mouth daily., Disp: , Rfl:    fluticasone (FLONASE) 50 MCG/ACT nasal spray, Place 2 sprays into both nostrils daily., Disp: 16 g, Rfl: 6   Multiple Vitamin (MULTIVITAMIN) tablet, Take 1 tablet by mouth daily., Disp: , Rfl:    phentermine 37.5 MG capsule, Take 1 capsule (37.5 mg total) by mouth every morning., Disp: 90 capsule, Rfl: 1   predniSONE (STERAPRED UNI-PAK 21 TAB) 10 MG (21) TBPK tablet, Take per package instructions. Do not skip doses. Finish entire supply., Disp: 1 each, Rfl: 0  Social History   Tobacco Use  Smoking Status Never  Smokeless Tobacco Never    No Known Allergies Objective:  There were no vitals filed for this visit. There is no height or weight on file to calculate BMI. Constitutional Well developed. Well nourished.  Vascular Foot warm and well perfused. Capillary refill normal to all digits.   Neurologic Normal speech. Oriented to person, place, and time. Epicritic sensation to light touch grossly present bilaterally.  Dermatologic Skin healing well without signs of infection. Skin edges  well coapted without signs of infection proximally. Distal aspect of insicion small area of dehiscence with granular base measuing about 0.1 cm .No signs of infection.   Orthopedic: Tenderness to palpation noted about the surgical site.   Radiographs: Good toe alignment.  Assessment:   1. Hallux limitus of left foot   2. Valgus deformity of both great toes    Plan:  Patient was evaluated and treated and all questions answered.  S/p foot surgery left -Progressing as expected post-operatively. -XR: Good toe alignment  -WB Status: WBAT in surgical shoe -Sutures: intact. Small area of dehiscence noted to distal incision. -Medications: None -Foot redressed. Follow-up in 2 weeks with Dr. Paulla Dolly   No follow-ups on file.

## 2021-05-15 ENCOUNTER — Ambulatory Visit (INDEPENDENT_AMBULATORY_CARE_PROVIDER_SITE_OTHER): Payer: No Typology Code available for payment source

## 2021-05-15 ENCOUNTER — Other Ambulatory Visit: Payer: Self-pay

## 2021-05-15 ENCOUNTER — Encounter: Payer: Self-pay | Admitting: Podiatry

## 2021-05-15 ENCOUNTER — Ambulatory Visit (INDEPENDENT_AMBULATORY_CARE_PROVIDER_SITE_OTHER): Payer: No Typology Code available for payment source | Admitting: Podiatry

## 2021-05-15 DIAGNOSIS — M205X2 Other deformities of toe(s) (acquired), left foot: Secondary | ICD-10-CM

## 2021-05-15 DIAGNOSIS — Z9889 Other specified postprocedural states: Secondary | ICD-10-CM

## 2021-05-15 NOTE — Progress Notes (Signed)
Subjective:   Patient ID: Joel Stevens, male   DOB: 71 y.o.   MRN: [id number]   HPI Patient presents stating the end of the incision on the left shows a small area but it is getting better and I am doing well with my surgery   ROS      Objective:  Physical Exam  Neurovascular status intact negative Bevelyn Buckles' sign noted wound edges well coapted very small area measuring about 3 mm x 2 mm with distal aspect left incision site that has some crusted tissue on it no erythema no edema no drainage     Assessment:  Overall doing well very minimal distal dehiscence left resolving     Plan:  Advised on bandage usage with a drop in Neosporin and let it air dry at home and still continuing open toed shoes and reviewed x-rays patient to be seen back 4 weeks or earlier if needed or if any redness drainage or pain were to occur  X-rays indicate satisfactory resection of bone good alignment noted

## 2021-05-17 HISTORY — PX: OTHER SURGICAL HISTORY: SHX169

## 2021-05-28 ENCOUNTER — Encounter: Payer: Self-pay | Admitting: Registered Nurse

## 2021-05-28 DIAGNOSIS — Z6830 Body mass index (BMI) 30.0-30.9, adult: Secondary | ICD-10-CM

## 2021-05-29 ENCOUNTER — Other Ambulatory Visit: Payer: Self-pay | Admitting: Registered Nurse

## 2021-05-29 ENCOUNTER — Other Ambulatory Visit: Payer: Self-pay

## 2021-05-29 DIAGNOSIS — Z6830 Body mass index (BMI) 30.0-30.9, adult: Secondary | ICD-10-CM

## 2021-05-29 MED ORDER — PHENTERMINE HCL 37.5 MG PO CAPS: 37.5000 mg | ORAL_CAPSULE | ORAL | 1 refills | Status: DC

## 2021-05-29 NOTE — Telephone Encounter (Signed)
° °  Patient would like the tablets instead of the capsules.    /Patient is requesting a refill of the following medications:/ Requested Prescriptions   Pending Prescriptions Disp Refills   phentermine 37.5 MG capsule 90 capsule 1    Sig: Take 1 capsule (37.5 mg total) by mouth every morning.    Date of patient request: 05/28/2021 Last office visit: 01/04/2021 Date of last refill: 01/04/2021 Last refill amount: 90 capsules 1 refill Follow up time period per chart: 07/04/2021

## 2021-05-29 NOTE — Telephone Encounter (Signed)
Pt is asking for refill phentermine, but requests pills as he breaks them in half

## 2021-05-30 ENCOUNTER — Other Ambulatory Visit: Payer: Self-pay | Admitting: Registered Nurse

## 2021-05-30 DIAGNOSIS — Z6830 Body mass index (BMI) 30.0-30.9, adult: Secondary | ICD-10-CM

## 2021-05-30 MED ORDER — PHENTERMINE HCL 37.5 MG PO TABS: 37.5000 mg | ORAL_TABLET | Freq: Every day | ORAL | 1 refills | Status: DC

## 2021-06-12 ENCOUNTER — Other Ambulatory Visit: Payer: Self-pay

## 2021-06-12 ENCOUNTER — Encounter (HOSPITAL_COMMUNITY): Payer: Self-pay | Admitting: Emergency Medicine

## 2021-06-12 ENCOUNTER — Emergency Department (HOSPITAL_COMMUNITY): Payer: No Typology Code available for payment source

## 2021-06-12 ENCOUNTER — Encounter: Payer: Self-pay | Admitting: Podiatry

## 2021-06-12 ENCOUNTER — Ambulatory Visit (INDEPENDENT_AMBULATORY_CARE_PROVIDER_SITE_OTHER): Payer: No Typology Code available for payment source | Admitting: Podiatry

## 2021-06-12 ENCOUNTER — Ambulatory Visit (INDEPENDENT_AMBULATORY_CARE_PROVIDER_SITE_OTHER): Payer: No Typology Code available for payment source

## 2021-06-12 ENCOUNTER — Emergency Department (HOSPITAL_COMMUNITY)
Admission: EM | Admit: 2021-06-12 | Discharge: 2021-06-12 | Disposition: A | Discharge status: Home / Self Care | Payer: No Typology Code available for payment source | Attending: Emergency Medicine | Admitting: Emergency Medicine

## 2021-06-12 DIAGNOSIS — Y92838 Other recreation area as the place of occurrence of the external cause: Secondary | ICD-10-CM | POA: Insufficient documentation

## 2021-06-12 DIAGNOSIS — S43004A Unspecified dislocation of right shoulder joint, initial encounter: Secondary | ICD-10-CM | POA: Insufficient documentation

## 2021-06-12 DIAGNOSIS — S43014A Anterior dislocation of right humerus, initial encounter: Secondary | ICD-10-CM | POA: Diagnosis not present

## 2021-06-12 DIAGNOSIS — S4991XA Unspecified injury of right shoulder and upper arm, initial encounter: Secondary | ICD-10-CM | POA: Diagnosis present

## 2021-06-12 DIAGNOSIS — M205X2 Other deformities of toe(s) (acquired), left foot: Secondary | ICD-10-CM | POA: Diagnosis not present

## 2021-06-12 DIAGNOSIS — W1830XA Fall on same level, unspecified, initial encounter: Secondary | ICD-10-CM | POA: Diagnosis not present

## 2021-06-12 DIAGNOSIS — Z9889 Other specified postprocedural states: Secondary | ICD-10-CM

## 2021-06-12 MED ORDER — OXYCODONE-ACETAMINOPHEN 5-325 MG PO TABS
1.0000 | ORAL_TABLET | Freq: Once | ORAL | Status: AC
  Administered 2021-06-12: 1 via ORAL
  Filled 2021-06-12: qty 1

## 2021-06-12 MED ORDER — PROPOFOL 10 MG/ML IV BOLUS
2.0000 mg/kg | Freq: Once | INTRAVENOUS | Status: AC
Start: 2021-06-12 — End: 2021-06-12
  Administered 2021-06-12: 186 mg via INTRAVENOUS
  Filled 2021-06-12: qty 20

## 2021-06-12 MED ORDER — FENTANYL CITRATE PF 50 MCG/ML IJ SOSY
50.0000 ug | PREFILLED_SYRINGE | Freq: Once | INTRAMUSCULAR | Status: AC
  Administered 2021-06-12: 50 ug via INTRAVENOUS
  Filled 2021-06-12: qty 1

## 2021-06-12 MED ORDER — ONDANSETRON HCL 4 MG/2ML IJ SOLN
4.0000 mg | Freq: Once | INTRAMUSCULAR | Status: AC
  Administered 2021-06-12: 4 mg via INTRAVENOUS
  Filled 2021-06-12: qty 2

## 2021-06-12 MED ORDER — PROPOFOL 10 MG/ML IV BOLUS
INTRAVENOUS | Status: AC | PRN
  Administered 2021-06-12: 70 ug via INTRAVENOUS

## 2021-06-12 NOTE — ED Provider Notes (Signed)
[facility] EMERGENCY DEPARTMENT Provider Note   CSN: [id number] Arrival date & time: 06/12/21  1503     History  Chief Complaint  Patient presents with   Joel Stevens is a 71 y.o. male who presents emergency department with a chief complaint of right shoulder injury.  Is a 71 year old male who was playing with his Joel Stevens today when his dog got excited and knocked him down falling onto his right shoulder.  He did not hit his head or used lose consciousness and he is not on any blood thinners.  He had immediate severe pain and was unable to move the right shoulder.  This happened approximately 3 hours prior to arrival.  The patient has not had nothing by mouth since 7 AM.   Fall      Home Medications Prior to Admission medications   Medication Sig Start Date End Date Taking? Authorizing Provider  doxycycline (VIBRA-TABS) 100 MG tablet Take 1 tablet (100 mg total) by mouth 2 (two) times daily. 04/12/21   Maximiano Coss, NP  finasteride (PROSCAR) 5 MG tablet Take 5 mg by mouth daily. 10/13/20   [provider]  fluticasone (FLONASE) 50 MCG/ACT nasal spray Place 2 sprays into both nostrils daily. 04/12/21   Maximiano Coss, NP  Multiple Vitamin (MULTIVITAMIN) tablet Take 1 tablet by mouth daily.    [provider]  phentermine (ADIPEX-P) 37.5 MG tablet Take 1 tablet (37.5 mg total) by mouth daily before breakfast. 05/30/21   Maximiano Coss, NP  predniSONE (STERAPRED UNI-PAK 21 TAB) 10 MG (21) TBPK tablet Take per package instructions. Do not skip doses. Finish entire supply. 04/12/21   Maximiano Coss, NP      Allergies    Patient has no known allergies.    Review of Systems   Review of Systems  Physical Exam Updated Vital Signs BP (!) 167/73    Pulse 60    Temp (!) 97.3 F (36.3 C) (Oral)    Resp 18    SpO2 95%  Physical Exam  ED Results / Procedures / Treatments   Labs (all labs ordered are listed, but only  abnormal results are displayed) Labs Reviewed - No data to display  EKG None  Radiology DG Shoulder Right  Result Date: 06/12/2021 CLINICAL DATA:  Right shoulder pain after fall EXAM: RIGHT SHOULDER - 2+ VIEW COMPARISON:  None. FINDINGS: There is inferior medial positioning of the humeral head on frontal view and anterior dislocation seen on transscapular Y-view. No definite Hill-Sachs or bony Bankart lesions are visualized. Normal alignment of the acromioclavicular joint. The visualized portion of the right lung is unremarkable. IMPRESSION: Anterior glenohumeral dislocation. Electronically Signed   By: Yvonne Kendall M.D.   On: 06/12/2021 16:40    Procedures Reduction of dislocation  Date/Time: 06/12/2021 11:01 PM Performed by: Margarita Mail, PA-C Authorized by: Margarita Mail, PA-C  Consent: Verbal consent obtained. Risks and benefits: risks, benefits and alternatives were discussed Consent given by: patient Patient understanding: patient states understanding of the procedure being performed Required items: required blood products, implants, devices, and special equipment available Patient identity confirmed: verbally with patient Time out: Immediately prior to procedure a "time out" was called to verify the correct patient, procedure, equipment, support staff and site/side marked as required. Preparation: Patient was prepped and draped in the usual sterile fashion.  Sedation: Patient sedated: yes (see seperate sedation note by Dr. Lavenia Atlas)  Patient tolerance: patient tolerated the procedure well with  no immediate complications      Medications Ordered in ED Medications  propofol (DIPRIVAN) 10 mg/mL bolus/IV push 186 mg (has no administration in time range)  oxyCODONE-acetaminophen (PERCOCET/ROXICET) 5-325 MG per tablet 1 tablet (1 tablet Oral Given 06/12/21 1541)    ED Course/ Medical Decision Making/ A&P Clinical Course as of 06/12/21 2301  Mon Jun 12, 2021  1700  DG Shoulder Right I visualized the right shoulder area x-ray which shows an anterior dislocation [AH]  2151 DG Shoulder Right Portable Repeat shoulder shows successful reduction of R ant shoulder dislocation [AH]    Clinical Course User Index [AH] Margarita Mail, PA-C                           Medical Decision Making Patient here with right shoulder injury.  Patient had successful anterior shoulder dislocation reduction after procedural sedation.  Placed in splint immobilizer.  Patient had some numbness in the fingers which resolved after reduction.  He is neurovascularly intact and appears appropriate for discharge at this time with outpatient follow-up  Amount and/or Complexity of Data Reviewed Radiology: ordered. Decision-making details documented in ED Course.  Risk Prescription drug management.  Final Clinical Impression(s) / ED Diagnoses Final diagnoses:  None    Rx / DC Orders ED Discharge Orders     None         Margarita Mail, PA-C 06/12/21 2303    Lorelle Gibbs, DO 06/12/21 2347

## 2021-06-12 NOTE — Progress Notes (Signed)
Subjective:   Patient ID: Joel Stevens, male   DOB: 71 y.o.   MRN: [id number]   HPI Patient presents stating he is doing well with surgery and that he is walking 3 miles a day   ROS      Objective:  Physical Exam  Neurovascular status intact with patient's left foot healing well wound edges coapted well range of motion adequate no other pathology     Assessment:  Doing well post osteotomy first metatarsal left with removal of bone spurs     Plan:  H&P reviewed condition recommended the continuation of conservative care wider shoe gear and range of motion exercises.  Patient's discharge will be seen back as needed  X-rays indicate that bone resection has occurred of a satisfactory nature good alignment noted

## 2021-06-12 NOTE — ED Triage Notes (Signed)
Patient here after being knocked to the ground by his dog and landing on his right shoulder, denies head injury. Patient is alert, oriented, and in no apparent distress at this time.

## 2021-06-12 NOTE — Progress Notes (Addendum)
Orthopedic Tech Progress Note Patient Details:  Joel Stevens 1950/10/11 [id number]  Ortho Devices Type of Ortho Device: Sling immobilizer Ortho Device/Splint Location: rue Ortho Device/Splint Interventions: Ordered, Application, Adjustment  I applied sling post reduction. Post Interventions Patient Tolerated: Well Instructions Provided: Care of device, Adjustment of device  Karolee Stamps 06/12/2021, 11:05 PM

## 2021-06-12 NOTE — Discharge Instructions (Addendum)
Contact a health care provider if: Your brace or sling gets damaged. Get help right away if: Your pain gets worse rather than better. You lose feeling in your arm or hand. Your arm or hand becomes white and cold.

## 2021-06-12 NOTE — ED Provider Triage Note (Signed)
Emergency Medicine Provider Triage Evaluation Note  Joel Stevens , a 71 y.o. male  was evaluated in triage.  Pt complains of right shoulder pain after a fall.  Patient states he was playing with his dog who is quite large and the dog ran into him knocking him over onto his right shoulder.  Review of Systems  Positive:  Negative: See above  Physical Exam  BP (!) 182/82 (BP Location: Left Arm)    Pulse (!) 58    Temp (!) 97.3 F (36.3 C) (Oral)    Resp 16    SpO2 99%  Gen:   Awake, no distress   Resp:  Normal effort  MSK:   Moves extremities without difficulty  Other:  Right shoulder is tender to palpation  Medical Decision Making  Medically screening exam initiated at 3:35 PM.  Appropriate orders placed.  Evern Core Neu was informed that the remainder of the evaluation will be completed by another provider, this initial triage assessment does not replace that evaluation, and the importance of remaining in the ED until their evaluation is complete.     Myna Bright [place], [place] 06/12/21 1538

## 2021-06-12 NOTE — ED Provider Notes (Signed)
.  Sedation  Date/Time: 06/12/2021 11:01 PM Performed by: Lorelle Gibbs, DO Authorized by: Lorelle Gibbs, DO   Consent:    Consent obtained:  Written   Consent given by:  Patient   Risks discussed:  Prolonged hypoxia resulting in organ damage, prolonged sedation necessitating reversal, inadequate sedation, allergic reaction, nausea and vomiting   Alternatives discussed:  Analgesia without sedation Universal protocol:    Immediately prior to procedure, a time out was called: yes   Indications:    Procedure performed:  Dislocation reduction   Procedure necessitating sedation performed by:  Physician performing sedation Pre-sedation assessment:    Time since last food or drink:  6   ASA classification: class 2 - patient with mild systemic disease     Mallampati score:  II - soft palate, uvula, fauces visible   Pre-sedation assessments completed and reviewed: airway patency, cardiovascular function, mental status and nausea/vomiting   Immediate pre-procedure details:    Reviewed: vital signs and relevant labs/tests     Verified: bag valve mask available, emergency equipment available, IV patency confirmed, oxygen available and suction available   Procedure details (see MAR for exact dosages):    Preoxygenation:  Nasal cannula   Sedation:  Propofol   Intended level of sedation: moderate (conscious sedation)   Analgesia:  None   Intra-procedure monitoring:  Blood pressure monitoring, continuous capnometry, frequent LOC assessments, frequent vital sign checks, continuous pulse oximetry and cardiac monitor   Intra-procedure events: none     Total Provider sedation time (minutes):  15 Post-procedure details:    Procedure completion:  Tolerated well, no immediate complications    Lorelle Gibbs, DO 06/12/21 2303

## 2021-06-23 DIAGNOSIS — S43004A Unspecified dislocation of right shoulder joint, initial encounter: Secondary | ICD-10-CM | POA: Diagnosis not present

## 2021-06-23 DIAGNOSIS — M25511 Pain in right shoulder: Secondary | ICD-10-CM | POA: Diagnosis not present

## 2021-07-04 ENCOUNTER — Ambulatory Visit (INDEPENDENT_AMBULATORY_CARE_PROVIDER_SITE_OTHER): Payer: No Typology Code available for payment source | Admitting: Registered Nurse

## 2021-07-04 ENCOUNTER — Other Ambulatory Visit: Payer: Self-pay

## 2021-07-04 ENCOUNTER — Encounter: Payer: Self-pay | Admitting: Registered Nurse

## 2021-07-04 VITALS — BP 132/83 | HR 77 | Temp 98.0°F | Resp 18 | Ht 69.0 in | Wt 208.8 lb

## 2021-07-04 DIAGNOSIS — Z6830 Body mass index (BMI) 30.0-30.9, adult: Secondary | ICD-10-CM | POA: Diagnosis not present

## 2021-07-04 DIAGNOSIS — E785 Hyperlipidemia, unspecified: Secondary | ICD-10-CM | POA: Diagnosis not present

## 2021-07-04 DIAGNOSIS — H6122 Impacted cerumen, left ear: Secondary | ICD-10-CM

## 2021-07-04 DIAGNOSIS — R7303 Prediabetes: Secondary | ICD-10-CM

## 2021-07-04 LAB — CBC WITH DIFFERENTIAL/PLATELET
Basophils Absolute: 0.1 10*3/uL (ref 0.0–0.1)
Basophils Relative: 1.1 % (ref 0.0–3.0)
Eosinophils Absolute: 0.1 10*3/uL (ref 0.0–0.7)
Eosinophils Relative: 2.1 % (ref 0.0–5.0)
HCT: 44 % (ref 39.0–52.0)
Hemoglobin: 14.6 g/dL (ref 13.0–17.0)
Lymphocytes Relative: 18.8 % (ref 12.0–46.0)
Lymphs Abs: 1.2 10*3/uL (ref 0.7–4.0)
MCHC: 33.3 g/dL (ref 30.0–36.0)
MCV: 88 fl (ref 78.0–100.0)
Monocytes Absolute: 0.6 10*3/uL (ref 0.1–1.0)
Monocytes Relative: 9.2 % (ref 3.0–12.0)
Neutro Abs: 4.4 10*3/uL (ref 1.4–7.7)
Neutrophils Relative %: 68.8 % (ref 43.0–77.0)
Platelets: 266 10*3/uL (ref 150.0–400.0)
RBC: 5 Mil/uL (ref 4.22–5.81)
RDW: 14.1 % (ref 11.5–15.5)
WBC: 6.4 10*3/uL (ref 4.0–10.5)

## 2021-07-04 LAB — LDL CHOLESTEROL, DIRECT: Direct LDL: 195 mg/dL

## 2021-07-04 LAB — COMPREHENSIVE METABOLIC PANEL
ALT: 33 U/L (ref 0–53)
AST: 25 U/L (ref 0–37)
Albumin: 4.5 g/dL (ref 3.5–5.2)
Alkaline Phosphatase: 66 U/L (ref 39–117)
BUN: 18 mg/dL (ref 6–23)
CO2: 32 mEq/L (ref 19–32)
Calcium: 9.5 mg/dL (ref 8.4–10.5)
Chloride: 98 mEq/L (ref 96–112)
Creatinine, Ser: 1 mg/dL (ref 0.40–1.50)
GFR: 76.3 mL/min (ref >60.00)
Glucose, Bld: 122 mg/dL — ABNORMAL HIGH (ref 70–99)
Potassium: 4.2 mEq/L (ref 3.5–5.1)
Sodium: 137 mEq/L (ref 135–145)
Total Bilirubin: 0.6 mg/dL (ref 0.2–1.2)
Total Protein: 6.8 g/dL (ref 6.0–8.3)

## 2021-07-04 LAB — LIPID PANEL
Cholesterol: 289 mg/dL — ABNORMAL HIGH (ref 0–200)
HDL: 66.5 mg/dL (ref >39.00)
NonHDL: 222.81
Total CHOL/HDL Ratio: 4
Triglycerides: 261 mg/dL — ABNORMAL HIGH (ref 0.0–149.0)
VLDL: 52.2 mg/dL — ABNORMAL HIGH (ref 0.0–40.0)

## 2021-07-04 LAB — HEMOGLOBIN A1C: Hgb A1c MFr Bld: 6.3 % (ref 4.6–6.5)

## 2021-07-04 NOTE — Patient Instructions (Addendum)
Mr. Orsak -  ? ?Great to see you ? ?Let's recheck labs ? ?We'll hesitate to make too many changes as I'm sure when you're back to your usual exercise, sugars and cholesterol will look even better. ? ?See you in 6 mo, we can extend to 1 year if labs are really good. ? ? ?Thanks, ? ?Rich  ? ? ? ?If you have lab work done today you will be contacted with your lab results within the next 2 weeks.  If you have not heard from [place] then please contact us. The fastest way to get your results is to register for My Chart. ? ? ?IF you received an x-ray today, you will receive an invoice from [facility]. Please contact [facility] at [phone] with questions or concerns regarding your invoice.  ? ?IF you received labwork today, you will receive an invoice from [place] [facility]. Please contact [facility] at [phone] with questions or concerns regarding your invoice.  ? ?Our billing staff will not be able to assist you with questions regarding bills from these companies. ? ?You will be contacted with the lab results as soon as they are available. The fastest way to get your results is to activate your My Chart account. Instructions are located on the last page of this paperwork. If you have not heard from [place] regarding the results in 2 weeks, please contact this office. ?  ? ? ?

## 2021-07-04 NOTE — Progress Notes (Signed)
? ?Established Patient Office Visit ? ?Subjective:  ?Patient ID: Joel Stevens, male    DOB: February 18, 1951  Age: 71 y.o. MRN: [id number] ? ?CC:  ?Chief Complaint  ?Patient presents with  ? Follow-up  ?  Patient states he is here for his 6 month follow up. Patient states he would like to have his left ear looked at because it keeps popping and having pain.  ? ? ?HPI ?[name] presents for 6 mo follow up  ? ?Prediabetes ?Lab Results  ?Component Value Date  ? HGBA1C 6.1 01/04/2021  ?No medications. Diet and exercise. ?Has been limited as he had bone spurs removed in foot. ?Weight has gone up as a result. ? ?Hld ?Stable. ?Diet and exercise ?Lab Results  ?Component Value Date  ? CHOL 217 (H) 01/04/2021  ? HDL 63.30 01/04/2021  ? LDLCALC 123 (H) 01/04/2021  ? TRIG 155.0 (H) 01/04/2021  ? CHOLHDL 3 01/04/2021  ? ?Ear pressure ?L side, on and off popping ?No changes to hearing ?Denies drainage, local adenopathy ?Hx of impacted cerumen, feels similar ? ? ? ?Past Medical History:  ?Diagnosis Date  ? BPH (benign prostatic hyperplasia)   ? Colon polyps   ? Elevated PSA   ? Hyperlipidemia   ? Melanoma ([place])   ? Prediabetes   ? ? ?Past Surgical History:  ?Procedure Laterality Date  ? MELANOMA EXCISION    ? PROSTATE BIOPSY  2022  ? negative  ? ? ?Family History  ?Problem Relation Age of Onset  ? Colon cancer Brother   ? ? ?Social History  ? ?Socioeconomic History  ? Marital status: Divorced  ?  Spouse name: Not on file  ? Number of children: Not on file  ? Years of education: Not on file  ? Highest education level: Not on file  ?Occupational History  ? Not on file  ?Tobacco Use  ? Smoking status: Never  ? Smokeless tobacco: Never  ?Vaping Use  ? Vaping Use: Never used  ?Substance and Sexual Activity  ? Alcohol use: Not Currently  ? Drug use: Never  ? Sexual activity: Not Currently  ?Other Topics Concern  ? Not on file  ?Social History Narrative  ? Not on file  ? ?Social Determinants of Health  ? ?Financial Resource  Strain: Not on file  ?Food Insecurity: Not on file  ?Transportation Needs: Not on file  ?Physical Activity: Not on file  ?Stress: Not on file  ?Social Connections: Not on file  ?Intimate Partner Violence: Not on file  ? ? ?Outpatient Medications Prior to Visit  ?Medication Sig Dispense Refill  ? finasteride (PROSCAR) 5 MG tablet Take 5 mg by mouth daily.    ? fluticasone (FLONASE) 50 MCG/ACT nasal spray Place 2 sprays into both nostrils daily. 16 g 6  ? Multiple Vitamin (MULTIVITAMIN) tablet Take 1 tablet by mouth daily.    ? phentermine (ADIPEX-P) 37.5 MG tablet Take 1 tablet (37.5 mg total) by mouth daily before breakfast. 90 tablet 1  ? doxycycline (VIBRA-TABS) 100 MG tablet Take 1 tablet (100 mg total) by mouth 2 (two) times daily. (Patient not taking: Reported on 07/04/2021) 20 tablet 0  ? predniSONE (STERAPRED UNI-PAK 21 TAB) 10 MG (21) TBPK tablet Take per package instructions. Do not skip doses. Finish entire supply. (Patient not taking: Reported on 07/04/2021) 1 each 0  ? ?No facility-administered medications prior to visit.  ? ? ?No Known Allergies ? ?ROS ?Review of Systems  ?Constitutional: Negative.   ?HENT: Negative.    ?  Eyes: Negative.   ?Respiratory: Negative.    ?Cardiovascular: Negative.   ?Gastrointestinal: Negative.   ?Genitourinary: Negative.   ?Musculoskeletal: Negative.   ?Skin: Negative.   ?Neurological: Negative.   ?Psychiatric/Behavioral: Negative.    ?All other systems reviewed and are negative. ? ?  ?Objective:  ?  ?Physical Exam ?Constitutional:   ?   General: He is not in acute distress. ?   Appearance: Normal appearance. He is normal weight. He is not ill-appearing, toxic-appearing or diaphoretic.  ?HENT:  ?   Right Ear: Tympanic membrane, ear canal and external ear normal. There is no impacted cerumen.  ?   Left Ear: Tympanic membrane, ear canal and external ear normal. There is impacted cerumen.  ?Cardiovascular:  ?   Rate and Rhythm: Normal rate and regular rhythm.  ?   Heart sounds:  Normal heart sounds. No murmur heard. ?  No friction rub. No gallop.  ?Pulmonary:  ?   Effort: Pulmonary effort is normal. No respiratory distress.  ?   Breath sounds: Normal breath sounds. No stridor. No wheezing, rhonchi or rales.  ?Chest:  ?   Chest wall: No tenderness.  ?Neurological:  ?   General: No focal deficit present.  ?   Mental Status: He is alert and oriented to person, place, and time. Mental status is at baseline.  ?Psychiatric:     ?   Mood and Affect: Mood normal.     ?   Behavior: Behavior normal.     ?   Thought Content: Thought content normal.     ?   Judgment: Judgment normal.  ? ?Procedure ?Discussed ear lavage and instrumentation risks, benefits, AE. ?Pt voices understanding ?Ears lavaged with warm water ?Moderate amount of tan cerumen removed ?TM visualized ?No instrumentation needed ?Pt tolerated well ? ?BP 132/83   Pulse 77   Temp 98 ?F (36.7 ?C) (Temporal)   Resp 18   Ht 5' 9"  (1.753 m)   Wt 208 lb 12.8 oz (94.7 kg)   SpO2 99%   BMI 30.83 kg/m?  ?Wt Readings from Last 3 Encounters:  ?07/04/21 208 lb 12.8 oz (94.7 kg)  ?06/12/21 190 lb (86.2 kg)  ?01/04/21 204 lb 3.2 oz (92.6 kg)  ? ? ? ?Health Maintenance Due  ?Topic Date Due  ? Zoster Vaccines- Shingrix (1 of 2) Never done  ? Pneumonia Vaccine 9+ Years old (1 - PCV) Never done  ? COVID-19 Vaccine (4 - Booster for Pfizer series) 03/15/2020  ? ? ?There are no preventive care reminders to display for this patient. ? ?Lab Results  ?Component Value Date  ? TSH 2.520 07/08/2020  ? ?Lab Results  ?Component Value Date  ? WBC 5.4 01/04/2021  ? HGB 15.1 01/04/2021  ? HCT 45.1 01/04/2021  ? MCV 88.3 01/04/2021  ? PLT 191.0 01/04/2021  ? ?Lab Results  ?Component Value Date  ? NA 139 01/04/2021  ? K 4.1 01/04/2021  ? CO2 32 01/04/2021  ? GLUCOSE 95 01/04/2021  ? BUN 18 01/04/2021  ? CREATININE 0.83 01/04/2021  ? BILITOT 0.5 01/04/2021  ? ALKPHOS 60 01/04/2021  ? AST 21 01/04/2021  ? ALT 15 01/04/2021  ? PROT 7.2 01/04/2021  ? ALBUMIN 4.5  01/04/2021  ? CALCIUM 9.3 01/04/2021  ? EGFR 95 07/08/2020  ? GFR 89.04 01/04/2021  ? ?Lab Results  ?Component Value Date  ? CHOL 217 (H) 01/04/2021  ? ?Lab Results  ?Component Value Date  ? HDL 63.30 01/04/2021  ? ?Lab Results  ?  Component Value Date  ? LDLCALC 123 (H) 01/04/2021  ? ?Lab Results  ?Component Value Date  ? TRIG 155.0 (H) 01/04/2021  ? ?Lab Results  ?Component Value Date  ? CHOLHDL 3 01/04/2021  ? ?Lab Results  ?Component Value Date  ? HGBA1C 6.1 01/04/2021  ? ? ?  ?Assessment & Plan:  ? ?Problem List Items Addressed This Visit   ? ?  ? Other  ? Hyperlipidemia  ? Relevant Orders  ? CBC with Differential/Platelet  ? Comprehensive metabolic panel  ? Hemoglobin A1c  ? Lipid panel  ? Prediabetes  ? Relevant Orders  ? CBC with Differential/Platelet  ? Comprehensive metabolic panel  ? Hemoglobin A1c  ? Lipid panel  ? ?Other Visit Diagnoses   ? ? BMI 30.0-30.9,adult    -  Primary  ? Relevant Orders  ? CBC with Differential/Platelet  ? Comprehensive metabolic panel  ? Hemoglobin A1c  ? Lipid panel  ? Impacted cerumen, left ear      ? ?  ? ? ?No orders of the defined types were placed in this encounter. ? ? ?Follow-up: Return in about 6 months (around 01/04/2022) for prediabtes, hld.  ? ?PLAN ?Impacted cerumen resolved. Pt tolerated lavage well ?Labs collected. Will follow up with the patient as warranted. ?Encourage healthy lifestyle. ?He will follow up with ortho and podiatry as scheduled. ?Return in 6 mo, sooner with concerns ?Patient encouraged to call clinic with any questions, comments, or concerns. ? ?Maximiano Coss, NP ?

## 2021-07-05 ENCOUNTER — Encounter: Payer: Self-pay | Admitting: Registered Nurse

## 2021-07-05 DIAGNOSIS — S43004A Unspecified dislocation of right shoulder joint, initial encounter: Secondary | ICD-10-CM | POA: Diagnosis not present

## 2021-07-18 DIAGNOSIS — S43004A Unspecified dislocation of right shoulder joint, initial encounter: Secondary | ICD-10-CM | POA: Diagnosis not present

## 2021-07-18 DIAGNOSIS — M25511 Pain in right shoulder: Secondary | ICD-10-CM | POA: Diagnosis not present

## 2021-07-20 DIAGNOSIS — R972 Elevated prostate specific antigen [PSA]: Secondary | ICD-10-CM | POA: Diagnosis not present

## 2021-07-21 DIAGNOSIS — S43004A Unspecified dislocation of right shoulder joint, initial encounter: Secondary | ICD-10-CM | POA: Diagnosis not present

## 2021-07-21 DIAGNOSIS — M25511 Pain in right shoulder: Secondary | ICD-10-CM | POA: Diagnosis not present

## 2021-07-25 DIAGNOSIS — M25511 Pain in right shoulder: Secondary | ICD-10-CM | POA: Diagnosis not present

## 2021-07-25 DIAGNOSIS — S43004A Unspecified dislocation of right shoulder joint, initial encounter: Secondary | ICD-10-CM | POA: Diagnosis not present

## 2021-07-27 DIAGNOSIS — S43004A Unspecified dislocation of right shoulder joint, initial encounter: Secondary | ICD-10-CM | POA: Diagnosis not present

## 2021-07-27 DIAGNOSIS — M25511 Pain in right shoulder: Secondary | ICD-10-CM | POA: Diagnosis not present

## 2021-08-01 DIAGNOSIS — S43004A Unspecified dislocation of right shoulder joint, initial encounter: Secondary | ICD-10-CM | POA: Diagnosis not present

## 2021-08-01 DIAGNOSIS — M25511 Pain in right shoulder: Secondary | ICD-10-CM | POA: Diagnosis not present

## 2021-08-03 DIAGNOSIS — S43004A Unspecified dislocation of right shoulder joint, initial encounter: Secondary | ICD-10-CM | POA: Diagnosis not present

## 2021-08-03 DIAGNOSIS — M25511 Pain in right shoulder: Secondary | ICD-10-CM | POA: Diagnosis not present

## 2021-08-31 ENCOUNTER — Ambulatory Visit (INDEPENDENT_AMBULATORY_CARE_PROVIDER_SITE_OTHER): Payer: No Typology Code available for payment source

## 2021-08-31 DIAGNOSIS — Z0000 Encounter for general adult medical examination without abnormal findings: Secondary | ICD-10-CM

## 2021-08-31 DIAGNOSIS — Z1211 Encounter for screening for malignant neoplasm of colon: Secondary | ICD-10-CM

## 2021-08-31 DIAGNOSIS — N400 Enlarged prostate without lower urinary tract symptoms: Secondary | ICD-10-CM | POA: Insufficient documentation

## 2021-08-31 NOTE — Patient Instructions (Signed)
Joel Stevens , Thank you for taking time to come for your Medicare Wellness Visit. I appreciate your ongoing commitment to your health goals. Please review the following plan we discussed and let me know if I can assist you in the future.   Screening recommendations/referrals: Colonoscopy: done 07/17/16. A referral has been sent to [facility] today. They will contact you for an appointment.  Recommended yearly ophthalmology/optometry visit for glaucoma screening and checkup Recommended yearly dental visit for hygiene and checkup  Vaccinations: Influenza vaccine: done 01/04/21 Pneumococcal vaccine: due Tdap vaccine: due Shingles vaccine: Shingrix discussed. Please contact your pharmacy for coverage information.  Covid-19: done 06/08/19, 06/29/19, 01/19/20  Advanced directives: Please bring a copy of your health care power of attorney and living will to the office at your convenience.   Conditions/risks identified: Keep up the great work!  Next appointment: Follow up in one year for your annual wellness visit.   Preventive Care 6 Years and Older, Male Preventive care refers to lifestyle choices and visits with your health care provider that can promote health and wellness. What does preventive care include? A yearly physical exam. This is also called an annual well check. Dental exams once or twice a year. Routine eye exams. Ask your health care provider how often you should have your eyes checked. Personal lifestyle choices, including: Daily care of your teeth and gums. Regular physical activity. Eating a healthy diet. Avoiding tobacco and drug use. Limiting alcohol use. Practicing safe sex. Taking low doses of aspirin every day. Taking vitamin and mineral supplements as recommended by your health care provider. What happens during an annual well check? The services and screenings done by your health care provider during your annual well check will depend on your age,  overall health, lifestyle risk factors, and family history of disease. Counseling  Your health care provider may ask you questions about your: Alcohol use. Tobacco use. Drug use. Emotional well-being. Home and relationship well-being. Sexual activity. Eating habits. History of falls. Memory and ability to understand (cognition). Work and work Statistician. Screening  You may have the following tests or measurements: Height, weight, and BMI. Blood pressure. Lipid and cholesterol levels. These may be checked every 5 years, or more frequently if you are over 71 years old. Skin check. Lung cancer screening. You may have this screening every year starting at age 68 if you have a 30-pack-year history of smoking and currently smoke or have quit within the past 15 years. Fecal occult blood test (FOBT) of the stool. You may have this test every year starting at age 23. Flexible sigmoidoscopy or colonoscopy. You may have a sigmoidoscopy every 5 years or a colonoscopy every 10 years starting at age 21. Prostate cancer screening. Recommendations will vary depending on your family history and other risks. Hepatitis C blood test. Hepatitis B blood test. Sexually transmitted disease (STD) testing. Diabetes screening. This is done by checking your blood sugar (glucose) after you have not eaten for a while (fasting). You may have this done every 1-3 years. Abdominal aortic aneurysm (AAA) screening. You may need this if you are a current or former smoker. Osteoporosis. You may be screened starting at age 50 if you are at high risk. Talk with your health care provider about your test results, treatment options, and if necessary, the need for more tests. Vaccines  Your health care provider may recommend certain vaccines, such as: Influenza vaccine. This is recommended every year. Tetanus, diphtheria, and acellular pertussis (Tdap, Td) vaccine.  You may need a Td booster every 10 years. Zoster vaccine.  You may need this after age 26. Pneumococcal 13-valent conjugate (PCV13) vaccine. One dose is recommended after age 15. Pneumococcal polysaccharide (PPSV23) vaccine. One dose is recommended after age 64. Talk to your health care provider about which screenings and vaccines you need and how often you need them. This information is not intended to replace advice given to you by your health care provider. Make sure you discuss any questions you have with your health care provider. Document Released: 04/29/2015 Document Revised: 12/21/2015 Document Reviewed: 02/01/2015 Elsevier Interactive Patient Education  2017 [place] Prevention in the Home Falls can cause injuries. They can happen to people of all ages. There are many things you can do to make your home safe and to help prevent falls. What can I do on the outside of my home? Regularly fix the edges of walkways and driveways and fix any cracks. Remove anything that might make you trip as you walk through a door, such as a raised step or threshold. Trim any bushes or trees on the path to your home. Use bright outdoor lighting. Clear any walking paths of anything that might make someone trip, such as rocks or tools. Regularly check to see if handrails are loose or broken. Make sure that both sides of any steps have handrails. Any raised decks and porches should have guardrails on the edges. Have any leaves, snow, or ice cleared regularly. Use sand or salt on walking paths during winter. Clean up any spills in your garage right away. This includes oil or grease spills. What can I do in the bathroom? Use night lights. Install grab bars by the toilet and in the tub and shower. Do not use towel bars as grab bars. Use non-skid mats or decals in the tub or shower. If you need to sit down in the shower, use a plastic, non-slip stool. Keep the floor dry. Clean up any water that spills on the floor as soon as it happens. Remove soap  buildup in the tub or shower regularly. Attach bath mats securely with double-sided non-slip rug tape. Do not have throw rugs and other things on the floor that can make you trip. What can I do in the bedroom? Use night lights. Make sure that you have a light by your bed that is easy to reach. Do not use any sheets or blankets that are too big for your bed. They should not hang down onto the floor. Have a firm chair that has side arms. You can use this for support while you get dressed. Do not have throw rugs and other things on the floor that can make you trip. What can I do in the kitchen? Clean up any spills right away. Avoid walking on wet floors. Keep items that you use a lot in easy-to-reach places. If you need to reach something above you, use a strong step stool that has a grab bar. Keep electrical cords out of the way. Do not use floor polish or wax that makes floors slippery. If you must use wax, use non-skid floor wax. Do not have throw rugs and other things on the floor that can make you trip. What can I do with my stairs? Do not leave any items on the stairs. Make sure that there are handrails on both sides of the stairs and use them. Fix handrails that are broken or loose. Make sure that handrails are as long as  the stairways. Check any carpeting to make sure that it is firmly attached to the stairs. Fix any carpet that is loose or worn. Avoid having throw rugs at the top or bottom of the stairs. If you do have throw rugs, attach them to the floor with carpet tape. Make sure that you have a light switch at the top of the stairs and the bottom of the stairs. If you do not have them, ask someone to add them for you. What else can I do to help prevent falls? Wear shoes that: Do not have high heels. Have rubber bottoms. Are comfortable and fit you well. Are closed at the toe. Do not wear sandals. If you use a stepladder: Make sure that it is fully opened. Do not climb a closed  stepladder. Make sure that both sides of the stepladder are locked into place. Ask someone to hold it for you, if possible. Clearly mark and make sure that you can see: Any grab bars or handrails. First and last steps. Where the edge of each step is. Use tools that help you move around (mobility aids) if they are needed. These include: Canes. Walkers. Scooters. Crutches. Turn on the lights when you go into a dark area. Replace any light bulbs as soon as they burn out. Set up your furniture so you have a clear path. Avoid moving your furniture around. If any of your floors are uneven, fix them. If there are any pets around you, be aware of where they are. Review your medicines with your doctor. Some medicines can make you feel dizzy. This can increase your chance of falling. Ask your doctor what other things that you can do to help prevent falls. This information is not intended to replace advice given to you by your health care provider. Make sure you discuss any questions you have with your health care provider. Document Released: 01/27/2009 Document Revised: 09/08/2015 Document Reviewed: 05/07/2014 Elsevier Interactive Patient Education  2017 [employer].

## 2021-08-31 NOTE — Progress Notes (Signed)
Subjective:   Joel Stevens is a 71 y.o. male who presents for Medicare Annual/Subsequent preventive examination.  Virtual Visit via Telephone Note  I connected with  Joel Stevens on 08/31/21 at 11:30 AM EDT by telephone and verified that I am speaking with the correct person using two identifiers.  Location: Patient: home Provider: office Persons participating in the virtual visit: patient/Nurse Health Advisor   I discussed the limitations, risks, security and privacy concerns of performing an evaluation and management service by telephone and the availability of in person appointments. The patient expressed understanding and agreed to proceed.  Interactive audio and video telecommunications were attempted between this nurse and patient, however failed, due to patient having technical difficulties OR patient did not have access to video capability.  We continued and completed visit with audio only.  Some vital signs may be absent or patient reported.   [name], LPN   Review of Systems     Cardiac Risk Factors include: advanced age (>62mn, >>12women)     Objective:    There were no vitals filed for this visit. There is no height or weight on file to calculate BMI.     08/31/2021   11:56 AM  Advanced Directives  Does Patient Have a Medical Advance Directive? Yes  Type of Advance Directive [place] Chart? No - copy requested    Current Medications (verified) Outpatient Encounter Medications as of 08/31/2021  Medication Sig   finasteride (PROSCAR) 5 MG tablet Take 5 mg by mouth daily.   Multiple Vitamin (MULTIVITAMIN) tablet Take 1 tablet by mouth daily.   phentermine (ADIPEX-P) 37.5 MG tablet Take 1 tablet (37.5 mg total) by mouth daily before breakfast.   [DISCONTINUED] finasteride (PROSCAR) 5 MG tablet Take 5 mg by mouth daily.   [DISCONTINUED] fluticasone (FLONASE) 50 MCG/ACT nasal spray  Place 2 sprays into both nostrils daily.   No facility-administered encounter medications on file as of 08/31/2021.    Allergies (verified) Patient has no known allergies.   History: Past Medical History:  Diagnosis Date   BPH (benign prostatic hyperplasia)    Colon polyps    Elevated PSA    Hyperlipidemia    Melanoma ([facility]    Prediabetes    Past Surgical History:  Procedure Laterality Date   bone spur removal Left 05/2021   foot   MELANOMA EXCISION     PROSTATE BIOPSY  2022   negative   Family History  Problem Relation Age of Onset   Colon cancer Brother    Cancer - Colon Brother    Social History   Socioeconomic History   Marital status: Divorced    Spouse name: Not on file   Number of children: Not on file   Years of education: Not on file   Highest education level: Not on file  Occupational History   Not on file  Tobacco Use   Smoking status: Never   Smokeless tobacco: Never  Vaping Use   Vaping Use: Never used  Substance and Sexual Activity   Alcohol use: Not Currently   Drug use: Never   Sexual activity: Not Currently  Other Topics Concern   Not on file  Social History Narrative   Pt lives alone   Social Determinants of Health   Financial Resource Strain: Low Risk    Difficulty of Paying Living Expenses: Not hard at all  Food Insecurity: No Food Insecurity  Worried About Charity fundraiser in the Last Year: Never true   [place] in the Last Year: Never true  Transportation Needs: No Transportation Needs   Lack of Transportation (Medical): No   Lack of Transportation (Non-Medical): No  Physical Activity: Sufficiently Active   Days of Exercise per Week: 7 days   Minutes of Exercise per Session: 90 min  Stress: No Stress Concern Present   Feeling of Stress : Not at all  Social Connections: Socially Isolated   Frequency of Communication with Friends and Family: More than three times a week   Frequency of Social Gatherings with Friends  and Family: Twice a week   Attends Religious Services: Never   Marine scientist or Organizations: No   Attends Music therapist: Never   Marital Status: Divorced    Tobacco Counseling Counseling given: Not Answered   Clinical Intake:  Pre-visit preparation completed: Yes  Pain : No/denies pain     Nutritional Risks: None Diabetes: No  How often do you need to have someone help you when you read instructions, pamphlets, or other written materials from your doctor or pharmacy?: 1 - Never    Interpreter Needed?: No  Information entered by :: [name] LPN   Activities of Daily Living    08/31/2021   11:59 AM 04/11/2021   12:06 PM  In your present state of health, do you have any difficulty performing the following activities:  Hearing? 1 0  Vision? 0 0  Difficulty concentrating or making decisions? 0 0  Walking or climbing stairs? 0 0  Dressing or bathing? 0 0  Doing errands, shopping? 0 0  Preparing Food and eating ? N   Using the Toilet? N   In the past six months, have you accidently leaked urine? N   Do you have problems with loss of bowel control? N   Managing your Medications? N   Managing your Finances? N   Housekeeping or managing your Housekeeping? N     Patient Care Team: Maximiano Coss, NP as PCP - General (Adult Health Nurse Practitioner) Ceasar Mons, MD as Consulting Physician (Urology)  Indicate any recent Medical Services you may have received from other than [facility] providers in the past year (date may be approximate).     Assessment:   This is a routine wellness examination for Joel Stevens.  Hearing/Vision screen Hearing Screening - Comments:: Pt c/o mild hearing difficulty; specifically with left ear Vision Screening - Comments:: Past due for eye exam; established with Dr. Marica Otter  Dietary issues and exercise activities discussed: Current Exercise Habits: Home exercise routine, Type of exercise: walking,  Time (Minutes): > 60, Frequency (Times/Week): 7, Weekly Exercise (Minutes/Week): 0, Intensity: Moderate, Exercise limited by: None identified   Goals Addressed   None    Depression Screen    08/31/2021   11:55 AM 07/04/2021    8:46 AM 04/11/2021   11:59 AM 07/08/2020    1:23 PM  PHQ 2/9 Scores  PHQ - 2 Score 0 0 0 0  PHQ- 9 Score  0 0     Fall Risk    08/31/2021   11:58 AM 07/04/2021    8:45 AM 04/11/2021   11:59 AM 01/04/2021    8:59 AM  Fall Risk   Falls in the past year? 1 1 0 0  Number falls in past yr: 0 0 0 0  Injury with Fall? 1 1 0 0  Risk for fall  due to : No Fall Risks No Fall Risks No Fall Risks No Fall Risks  Follow up Falls prevention discussed Falls evaluation completed Falls evaluation completed Falls evaluation completed    [place]:  Any stairs in or around the home? Yes  If so, are there any without handrails? No  Home free of loose throw rugs in walkways, pet beds, electrical cords, etc? Yes  Adequate lighting in your home to reduce risk of falls? Yes   ASSISTIVE DEVICES UTILIZED TO PREVENT FALLS:  Life alert? No  Use of a cane, walker or w/c? No  Grab bars in the bathroom? Yes  Shower chair or bench in shower? No  Elevated toilet seat or a handicapped toilet? No   TIMED UP AND GO:  Was the test performed? No . Telephonic visit.   Cognitive Function: Normal cognitive status assessed by direct observation by this Nurse Health Advisor. No abnormalities found.          Immunizations Immunization History  Administered Date(s) Administered   Fluad Quad(high Dose 65+) 01/04/2021   Influenza-Unspecified 05/03/2020   PFIZER(Purple Top)SARS-COV-2 Vaccination 06/08/2019, 06/29/2019, 01/19/2020    TDAP status: Due, Education has been provided regarding the importance of this vaccine. Advised may receive this vaccine at local pharmacy or Health Dept. Aware to provide a copy of the vaccination record if obtained from  local pharmacy or Health Dept. Verbalized acceptance and understanding.  Flu Vaccine status: Up to date  Pneumococcal vaccine status: Due, Education has been provided regarding the importance of this vaccine. Advised may receive this vaccine at local pharmacy or Health Dept. Aware to provide a copy of the vaccination record if obtained from local pharmacy or Health Dept. Verbalized acceptance and understanding.  Covid-19 vaccine status: Completed vaccines  Qualifies for Shingles Vaccine? Yes   Zostavax completed No   Shingrix Completed?: No.    Education has been provided regarding the importance of this vaccine. Patient has been advised to call insurance company to determine out of pocket expense if they have not yet received this vaccine. Advised may also receive vaccine at local pharmacy or Health Dept. Verbalized acceptance and understanding.  Screening Tests Health Maintenance  Topic Date Due   Zoster Vaccines- Shingrix (1 of 2) Never done   Pneumonia Vaccine 45+ Years old (1 - PCV) Never done   COVID-19 Vaccine (4 - Booster for Pfizer series) 03/15/2020   COLONOSCOPY (Pts 45-19yr Insurance coverage will need to be confirmed)  07/17/2021   TETANUS/TDAP  01/04/2022 (Originally 01/30/1970)   INFLUENZA VACCINE  11/14/2021   Hepatitis C Screening  Completed   HPV VACCINES  Aged Out    Health Maintenance  Health Maintenance Due  Topic Date Due   Zoster Vaccines- Shingrix (1 of 2) Never done   Pneumonia Vaccine 71 Years old (1 - PCV) Never done   COVID-19 Vaccine (4 - Booster for PMillstonseries) 03/15/2020   COLONOSCOPY (Pts 45-447yrInsurance coverage will need to be confirmed)  07/17/2021    Colorectal cancer screening: Type of screening: Colonoscopy. Completed 07/17/16. Repeat every 5 years  Lung Cancer Screening: (Low Dose CT Chest recommended if Age 71-80ears, 30 pack-year currently smoking OR have quit w/in 15years.) does not qualify.   Additional Screening:  Hepatitis  C Screening: does qualify; Completed 01/04/21  Vision Screening: Recommended annual ophthalmology exams for early detection of glaucoma and other disorders of the eye. Is the patient up to date with their annual eye exam?  Yes  Who is the provider or what is the name of the office in which the patient attends annual eye exams? Dr. Marica Otter.   Dental Screening: Recommended annual dental exams for proper oral hygiene  Community Resource Referral / Chronic Care Management: CRR required this visit?  No   CCM required this visit?  No      Plan:     I have personally reviewed and noted the following in the patient's chart:   Medical and social history Use of alcohol, tobacco or illicit drugs  Current medications and supplements including opioid prescriptions. Patient is not currently taking opioid prescriptions. Functional ability and status Nutritional status Physical activity Advanced directives List of other physicians Hospitalizations, surgeries, and ER visits in previous 12 months Vitals Screenings to include cognitive, depression, and falls Referrals and appointments  In addition, I have reviewed and discussed with patient certain preventive protocols, quality metrics, and best practice recommendations. A written personalized care plan for preventive services as well as general preventive health recommendations were provided to patient.     [name], LPN   11/23/9831   Nurse Notes: none

## 2021-11-09 ENCOUNTER — Encounter: Payer: Self-pay | Admitting: Registered Nurse

## 2021-11-16 ENCOUNTER — Other Ambulatory Visit: Payer: Self-pay | Admitting: Registered Nurse

## 2021-11-16 DIAGNOSIS — Z6830 Body mass index (BMI) 30.0-30.9, adult: Secondary | ICD-10-CM

## 2021-11-16 MED ORDER — PHENTERMINE HCL 37.5 MG PO TABS: 37.5000 mg | ORAL_TABLET | Freq: Every day | ORAL | 0 refills | Status: DC

## 2021-12-29 DIAGNOSIS — Z008 Encounter for other general examination: Secondary | ICD-10-CM | POA: Diagnosis not present

## 2021-12-29 DIAGNOSIS — E663 Overweight: Secondary | ICD-10-CM | POA: Diagnosis not present

## 2021-12-29 DIAGNOSIS — Z6827 Body mass index (BMI) 27.0-27.9, adult: Secondary | ICD-10-CM | POA: Diagnosis not present

## 2021-12-29 DIAGNOSIS — Z85820 Personal history of malignant melanoma of skin: Secondary | ICD-10-CM | POA: Diagnosis not present

## 2022-01-04 ENCOUNTER — Ambulatory Visit: Payer: No Typology Code available for payment source | Admitting: Registered Nurse

## 2022-06-14 IMAGING — CR DG SHOULDER 2+V*R*
3 series · 3 of 3 positions shown · non-contrast
Comparison: None.

CLINICAL DATA: Right shoulder pain after fall

EXAM:
RIGHT SHOULDER - 2+ VIEW

[shoulder grashey]
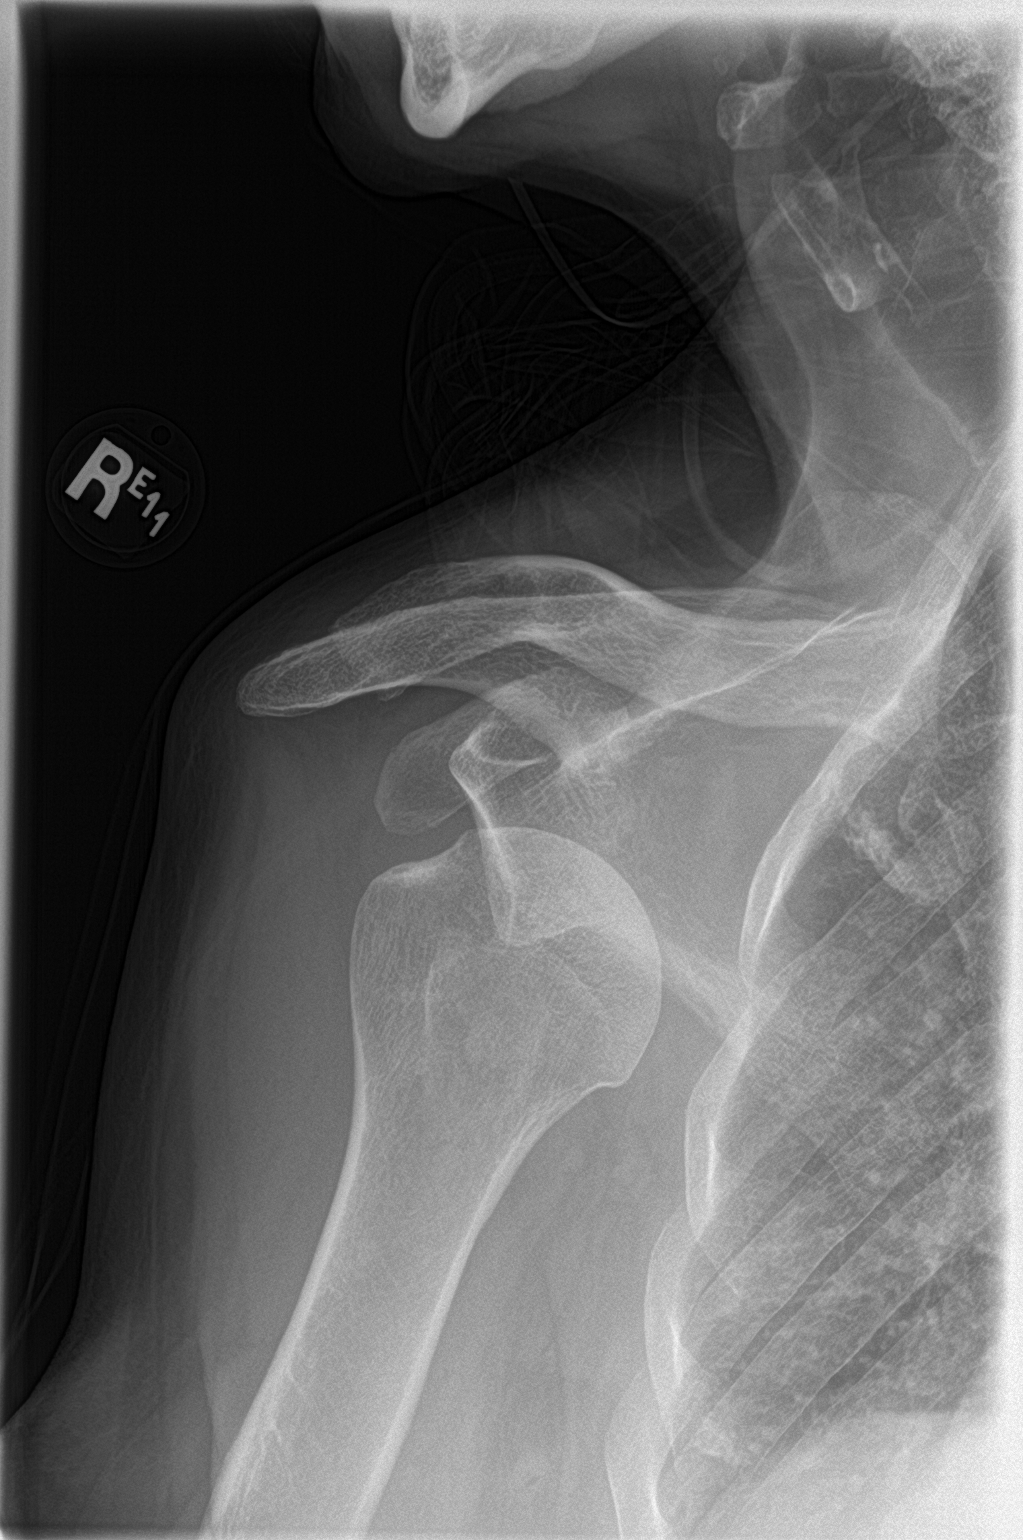

[shoulder ap neutral]
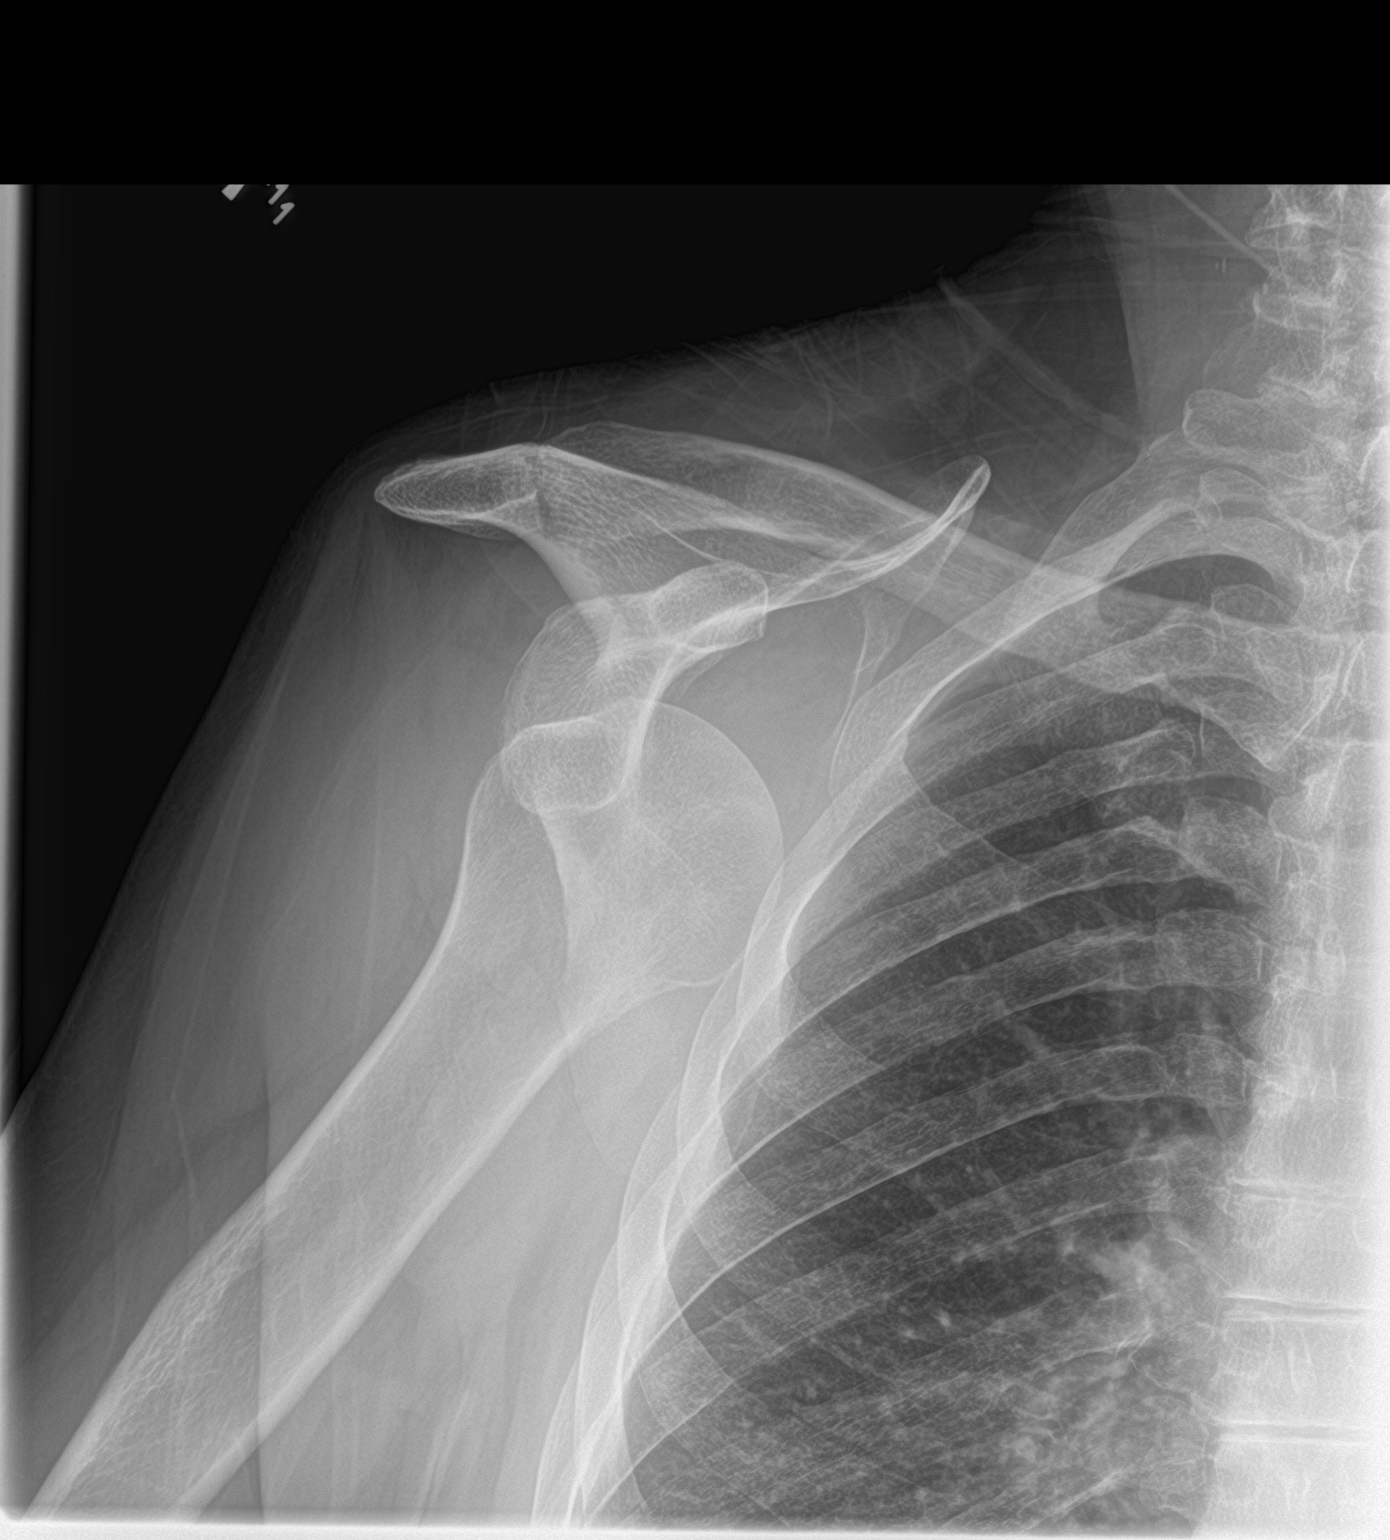

[shoulder y view]
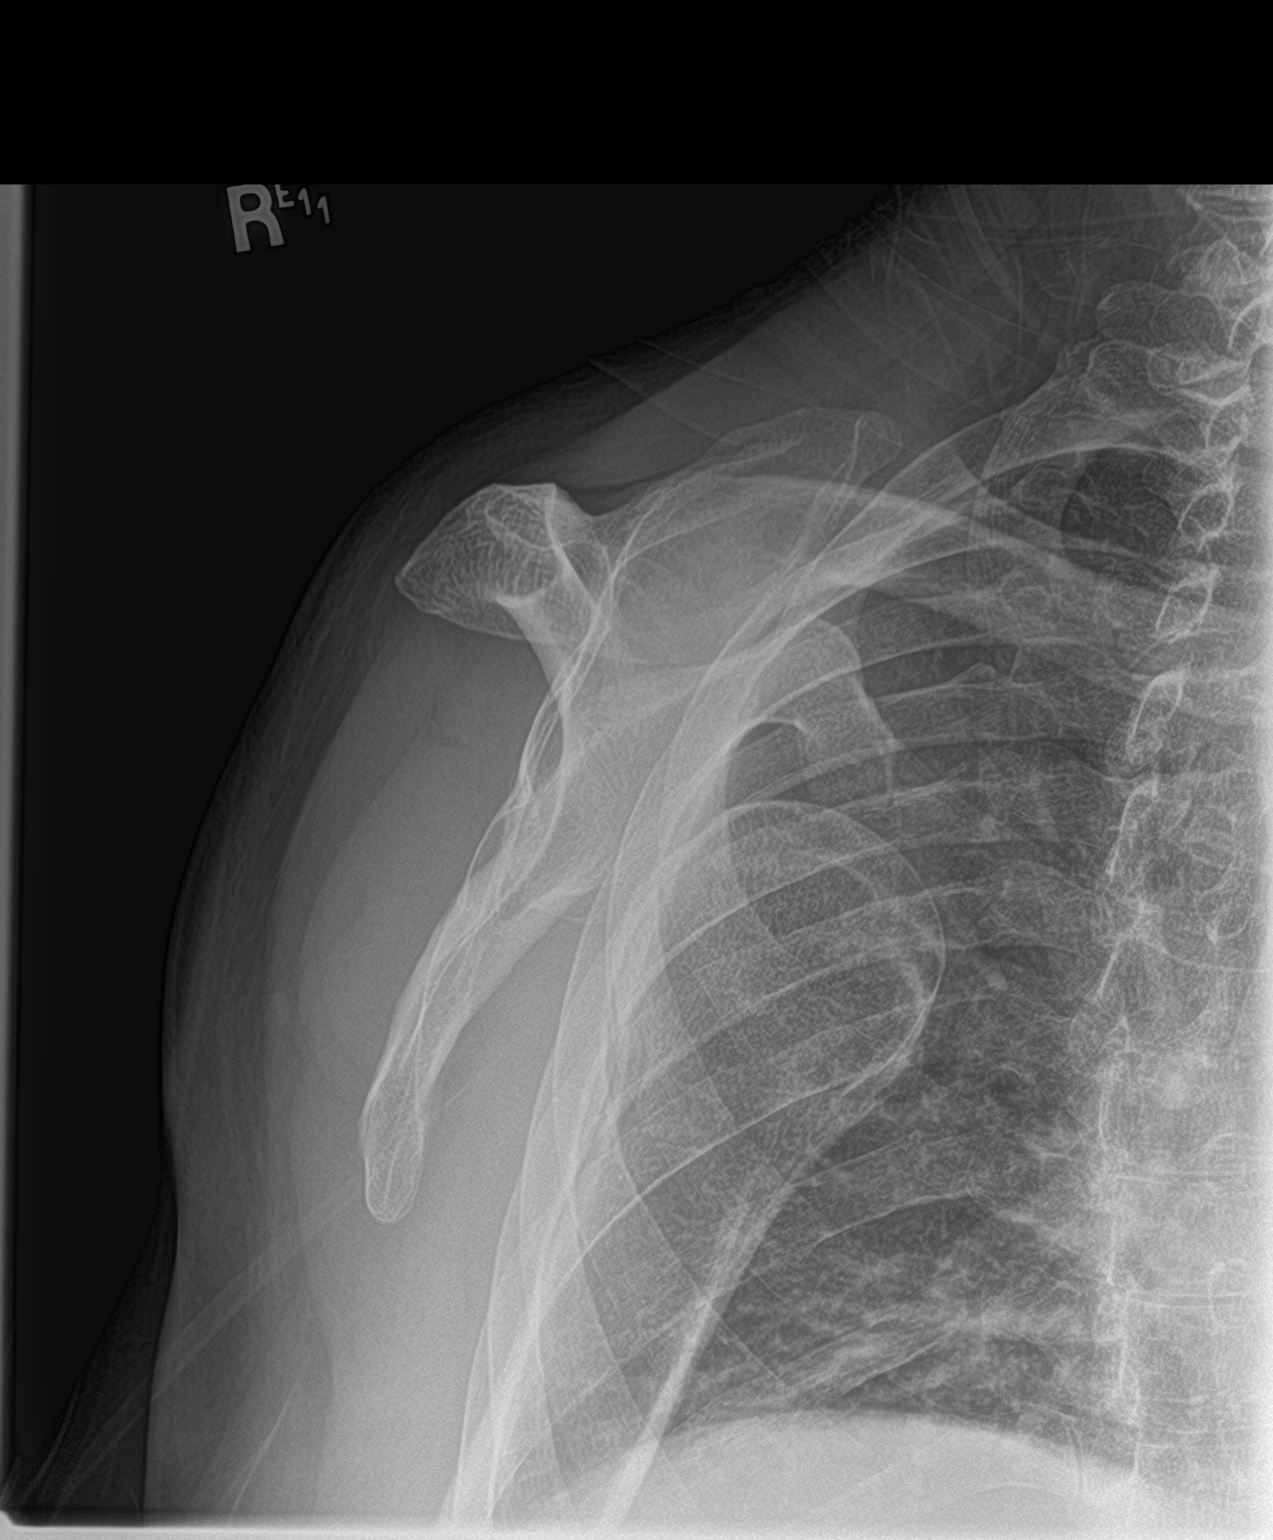

[3 of 3 positions shown; findings below may reference images not displayed]

FINDINGS: There is inferior medial positioning of the humeral head on frontal
view and anterior dislocation seen on transscapular Y-view. No
definite Hill-Sachs or bony Bankart lesions are visualized. Normal
alignment of the acromioclavicular joint. The visualized portion of
the right lung is unremarkable.
IMPRESSION: Anterior glenohumeral dislocation.

## 2022-07-30 DIAGNOSIS — R42 Dizziness and giddiness: Secondary | ICD-10-CM | POA: Diagnosis not present

## 2022-07-30 DIAGNOSIS — G4700 Insomnia, unspecified: Secondary | ICD-10-CM | POA: Diagnosis not present

## 2022-07-30 DIAGNOSIS — N400 Enlarged prostate without lower urinary tract symptoms: Secondary | ICD-10-CM | POA: Diagnosis not present

## 2022-07-30 DIAGNOSIS — R030 Elevated blood-pressure reading, without diagnosis of hypertension: Secondary | ICD-10-CM | POA: Diagnosis not present

## 2022-07-30 DIAGNOSIS — C439 Malignant melanoma of skin, unspecified: Secondary | ICD-10-CM | POA: Diagnosis not present

## 2022-07-30 DIAGNOSIS — R7303 Prediabetes: Secondary | ICD-10-CM | POA: Diagnosis not present

## 2022-07-30 DIAGNOSIS — R972 Elevated prostate specific antigen [PSA]: Secondary | ICD-10-CM | POA: Diagnosis not present

## 2022-07-30 DIAGNOSIS — Z7185 Encounter for immunization safety counseling: Secondary | ICD-10-CM | POA: Diagnosis not present

## 2022-07-30 DIAGNOSIS — K635 Polyp of colon: Secondary | ICD-10-CM | POA: Diagnosis not present

## 2022-09-03 DIAGNOSIS — R030 Elevated blood-pressure reading, without diagnosis of hypertension: Secondary | ICD-10-CM | POA: Diagnosis not present

## 2022-09-03 DIAGNOSIS — R739 Hyperglycemia, unspecified: Secondary | ICD-10-CM | POA: Diagnosis not present

## 2022-09-03 DIAGNOSIS — R972 Elevated prostate specific antigen [PSA]: Secondary | ICD-10-CM | POA: Diagnosis not present

## 2022-09-03 DIAGNOSIS — E559 Vitamin D deficiency, unspecified: Secondary | ICD-10-CM | POA: Diagnosis not present

## 2022-09-03 DIAGNOSIS — Z23 Encounter for immunization: Secondary | ICD-10-CM | POA: Diagnosis not present

## 2022-09-03 DIAGNOSIS — Z789 Other specified health status: Secondary | ICD-10-CM | POA: Diagnosis not present

## 2022-09-03 DIAGNOSIS — R7303 Prediabetes: Secondary | ICD-10-CM | POA: Diagnosis not present

## 2022-09-03 DIAGNOSIS — N400 Enlarged prostate without lower urinary tract symptoms: Secondary | ICD-10-CM | POA: Diagnosis not present

## 2022-09-21 DIAGNOSIS — Z08 Encounter for follow-up examination after completed treatment for malignant neoplasm: Secondary | ICD-10-CM | POA: Diagnosis not present

## 2022-09-21 DIAGNOSIS — Z1283 Encounter for screening for malignant neoplasm of skin: Secondary | ICD-10-CM | POA: Diagnosis not present

## 2022-09-21 DIAGNOSIS — Z85820 Personal history of malignant melanoma of skin: Secondary | ICD-10-CM | POA: Diagnosis not present

## 2022-09-21 DIAGNOSIS — D225 Melanocytic nevi of trunk: Secondary | ICD-10-CM | POA: Diagnosis not present

## 2022-09-21 DIAGNOSIS — D485 Neoplasm of uncertain behavior of skin: Secondary | ICD-10-CM | POA: Diagnosis not present

## 2022-10-08 DIAGNOSIS — Z1331 Encounter for screening for depression: Secondary | ICD-10-CM | POA: Diagnosis not present

## 2022-10-08 DIAGNOSIS — N400 Enlarged prostate without lower urinary tract symptoms: Secondary | ICD-10-CM | POA: Diagnosis not present

## 2022-10-08 DIAGNOSIS — Z23 Encounter for immunization: Secondary | ICD-10-CM | POA: Diagnosis not present

## 2022-10-08 DIAGNOSIS — E785 Hyperlipidemia, unspecified: Secondary | ICD-10-CM | POA: Diagnosis not present

## 2022-10-08 DIAGNOSIS — Z1339 Encounter for screening examination for other mental health and behavioral disorders: Secondary | ICD-10-CM | POA: Diagnosis not present

## 2022-10-08 DIAGNOSIS — I10 Essential (primary) hypertension: Secondary | ICD-10-CM | POA: Diagnosis not present

## 2022-10-08 DIAGNOSIS — Z0000 Encounter for general adult medical examination without abnormal findings: Secondary | ICD-10-CM | POA: Diagnosis not present

## 2022-10-08 DIAGNOSIS — R972 Elevated prostate specific antigen [PSA]: Secondary | ICD-10-CM | POA: Diagnosis not present

## 2022-12-03 DIAGNOSIS — E785 Hyperlipidemia, unspecified: Secondary | ICD-10-CM | POA: Diagnosis not present

## 2022-12-03 DIAGNOSIS — R972 Elevated prostate specific antigen [PSA]: Secondary | ICD-10-CM | POA: Diagnosis not present

## 2022-12-03 DIAGNOSIS — N400 Enlarged prostate without lower urinary tract symptoms: Secondary | ICD-10-CM | POA: Diagnosis not present

## 2022-12-03 DIAGNOSIS — I10 Essential (primary) hypertension: Secondary | ICD-10-CM | POA: Diagnosis not present

## 2022-12-10 DIAGNOSIS — N529 Male erectile dysfunction, unspecified: Secondary | ICD-10-CM | POA: Diagnosis not present

## 2022-12-10 DIAGNOSIS — R972 Elevated prostate specific antigen [PSA]: Secondary | ICD-10-CM | POA: Diagnosis not present

## 2022-12-10 DIAGNOSIS — I10 Essential (primary) hypertension: Secondary | ICD-10-CM | POA: Diagnosis not present

## 2022-12-10 DIAGNOSIS — E782 Mixed hyperlipidemia: Secondary | ICD-10-CM | POA: Diagnosis not present

## 2022-12-10 DIAGNOSIS — Z789 Other specified health status: Secondary | ICD-10-CM | POA: Diagnosis not present

## 2022-12-10 DIAGNOSIS — N400 Enlarged prostate without lower urinary tract symptoms: Secondary | ICD-10-CM | POA: Diagnosis not present

## 2022-12-10 DIAGNOSIS — Z23 Encounter for immunization: Secondary | ICD-10-CM | POA: Diagnosis not present

## 2023-02-21 DIAGNOSIS — N401 Enlarged prostate with lower urinary tract symptoms: Secondary | ICD-10-CM | POA: Diagnosis not present

## 2023-02-21 DIAGNOSIS — R972 Elevated prostate specific antigen [PSA]: Secondary | ICD-10-CM | POA: Diagnosis not present

## 2023-02-21 DIAGNOSIS — N528 Other male erectile dysfunction: Secondary | ICD-10-CM | POA: Diagnosis not present

## 2023-02-21 DIAGNOSIS — R3915 Urgency of urination: Secondary | ICD-10-CM | POA: Diagnosis not present

## 2023-03-28 DIAGNOSIS — H5202 Hypermetropia, left eye: Secondary | ICD-10-CM | POA: Diagnosis not present

## 2023-03-28 DIAGNOSIS — Z0100 Encounter for examination of eyes and vision without abnormal findings: Secondary | ICD-10-CM | POA: Diagnosis not present

## 2023-05-08 DIAGNOSIS — I10 Essential (primary) hypertension: Secondary | ICD-10-CM | POA: Diagnosis not present

## 2023-05-08 DIAGNOSIS — R5383 Other fatigue: Secondary | ICD-10-CM | POA: Diagnosis not present

## 2023-05-08 DIAGNOSIS — R739 Hyperglycemia, unspecified: Secondary | ICD-10-CM | POA: Diagnosis not present

## 2023-05-08 DIAGNOSIS — E785 Hyperlipidemia, unspecified: Secondary | ICD-10-CM | POA: Diagnosis not present

## 2023-05-20 DIAGNOSIS — R7303 Prediabetes: Secondary | ICD-10-CM | POA: Diagnosis not present

## 2023-05-20 DIAGNOSIS — N400 Enlarged prostate without lower urinary tract symptoms: Secondary | ICD-10-CM | POA: Diagnosis not present

## 2023-05-20 DIAGNOSIS — H6590 Unspecified nonsuppurative otitis media, unspecified ear: Secondary | ICD-10-CM | POA: Diagnosis not present

## 2023-05-20 DIAGNOSIS — K635 Polyp of colon: Secondary | ICD-10-CM | POA: Diagnosis not present

## 2023-05-20 DIAGNOSIS — R972 Elevated prostate specific antigen [PSA]: Secondary | ICD-10-CM | POA: Diagnosis not present

## 2023-07-10 ENCOUNTER — Other Ambulatory Visit: Payer: Self-pay | Admitting: Medical Genetics

## 2023-08-19 DIAGNOSIS — R972 Elevated prostate specific antigen [PSA]: Secondary | ICD-10-CM | POA: Diagnosis not present

## 2023-08-29 ENCOUNTER — Telehealth: Payer: Self-pay | Admitting: Gastroenterology

## 2023-08-29 NOTE — Telephone Encounter (Signed)
 Good morning Dr. Cherryl Corona,   DOD AM 08/29/23  We received a referral for patient to have a colonoscopy. Patient last had a colonoscopy in 2018 in [place] . Patient now lives locally and wishing to continue care with [facility]. Patient previous records are in [facility] for you to review and advise on scheduling.   Thank you.

## 2023-09-06 DIAGNOSIS — I10 Essential (primary) hypertension: Secondary | ICD-10-CM | POA: Diagnosis not present

## 2023-09-06 DIAGNOSIS — R635 Abnormal weight gain: Secondary | ICD-10-CM | POA: Diagnosis not present

## 2023-09-06 DIAGNOSIS — H6590 Unspecified nonsuppurative otitis media, unspecified ear: Secondary | ICD-10-CM | POA: Diagnosis not present

## 2023-09-06 DIAGNOSIS — H9201 Otalgia, right ear: Secondary | ICD-10-CM | POA: Diagnosis not present

## 2023-09-06 DIAGNOSIS — H7290 Unspecified perforation of tympanic membrane, unspecified ear: Secondary | ICD-10-CM | POA: Diagnosis not present

## 2023-09-10 ENCOUNTER — Encounter: Payer: Self-pay | Admitting: Gastroenterology

## 2023-09-10 NOTE — Telephone Encounter (Signed)
 Left voicemail for patient to call back to discuss scheduling colonoscopy

## 2023-09-25 DIAGNOSIS — R42 Dizziness and giddiness: Secondary | ICD-10-CM | POA: Diagnosis not present

## 2023-09-25 DIAGNOSIS — H9221 Otorrhagia, right ear: Secondary | ICD-10-CM | POA: Diagnosis not present

## 2023-09-26 ENCOUNTER — Other Ambulatory Visit: Payer: Self-pay

## 2023-09-27 DIAGNOSIS — E559 Vitamin D deficiency, unspecified: Secondary | ICD-10-CM | POA: Diagnosis not present

## 2023-09-27 DIAGNOSIS — G4700 Insomnia, unspecified: Secondary | ICD-10-CM | POA: Diagnosis not present

## 2023-09-27 DIAGNOSIS — F331 Major depressive disorder, recurrent, moderate: Secondary | ICD-10-CM | POA: Diagnosis not present

## 2023-09-27 DIAGNOSIS — F411 Generalized anxiety disorder: Secondary | ICD-10-CM | POA: Diagnosis not present

## 2023-09-27 DIAGNOSIS — R5383 Other fatigue: Secondary | ICD-10-CM | POA: Diagnosis not present

## 2023-09-27 DIAGNOSIS — I10 Essential (primary) hypertension: Secondary | ICD-10-CM | POA: Diagnosis not present

## 2023-10-10 DIAGNOSIS — Z23 Encounter for immunization: Secondary | ICD-10-CM | POA: Diagnosis not present

## 2023-10-10 DIAGNOSIS — Z1339 Encounter for screening examination for other mental health and behavioral disorders: Secondary | ICD-10-CM | POA: Diagnosis not present

## 2023-10-10 DIAGNOSIS — Z1331 Encounter for screening for depression: Secondary | ICD-10-CM | POA: Diagnosis not present

## 2023-10-10 DIAGNOSIS — Z0000 Encounter for general adult medical examination without abnormal findings: Secondary | ICD-10-CM | POA: Diagnosis not present

## 2023-10-11 ENCOUNTER — Ambulatory Visit (AMBULATORY_SURGERY_CENTER)

## 2023-10-11 VITALS — Ht 69.0 in | Wt 220.0 lb

## 2023-10-11 DIAGNOSIS — Z860100 Personal history of colon polyps, unspecified: Secondary | ICD-10-CM

## 2023-10-11 MED ORDER — NA SULFATE-K SULFATE-MG SULF 17.5-3.13-1.6 GM/177ML PO SOLN: 1.0000 | Freq: Once | ORAL | 0 refills | Status: AC

## 2023-10-11 NOTE — Progress Notes (Signed)

## 2023-10-28 ENCOUNTER — Encounter: Payer: Self-pay | Admitting: Gastroenterology

## 2023-10-28 ENCOUNTER — Ambulatory Visit (AMBULATORY_SURGERY_CENTER): Admitting: Gastroenterology

## 2023-10-28 VITALS — BP 137/87 | HR 58 | Resp 15

## 2023-10-28 DIAGNOSIS — D123 Benign neoplasm of transverse colon: Secondary | ICD-10-CM

## 2023-10-28 DIAGNOSIS — Z1211 Encounter for screening for malignant neoplasm of colon: Secondary | ICD-10-CM | POA: Diagnosis not present

## 2023-10-28 DIAGNOSIS — K5730 Diverticulosis of large intestine without perforation or abscess without bleeding: Secondary | ICD-10-CM | POA: Diagnosis not present

## 2023-10-28 DIAGNOSIS — D122 Benign neoplasm of ascending colon: Secondary | ICD-10-CM

## 2023-10-28 DIAGNOSIS — D128 Benign neoplasm of rectum: Secondary | ICD-10-CM | POA: Diagnosis not present

## 2023-10-28 DIAGNOSIS — Z860100 Personal history of colon polyps, unspecified: Secondary | ICD-10-CM

## 2023-10-28 DIAGNOSIS — E785 Hyperlipidemia, unspecified: Secondary | ICD-10-CM | POA: Diagnosis not present

## 2023-10-28 DIAGNOSIS — R7303 Prediabetes: Secondary | ICD-10-CM | POA: Diagnosis not present

## 2023-10-28 MED ORDER — SODIUM CHLORIDE 0.9 % IV SOLN: 500.0000 mL | INTRAVENOUS | Status: DC

## 2023-10-28 NOTE — Progress Notes (Signed)
 Pt resting comfortably. VSS. Airway intact. SBAR complete to RN. All questions answered.

## 2023-10-28 NOTE — Progress Notes (Signed)
 [facility] History and Physical   Primary Care Physician:  Waylan Almarie SAUNDERS, MD   Reason for Procedure:   Colon cancer screening/history of colon polyps  Plan:    Surveillance colonoscopy     HPI: Joel Stevens is a 73 y.o. male undergoing surveillance colonoscopy.  He has no chronic GI symptoms.  He had a colonoscopy in 2018 in [place] in which 7 subcentimeter polyps were removed (histology not available).  He was recommended to repeat colonoscopy in 3 years.  He reports having 11 polyps removed on his index colonoscopy in 2014.  His brother was diagnosed with colon cancer in his 4s.   Past Medical History:  Diagnosis Date   BPH (benign prostatic hyperplasia)    Colon polyps    Elevated PSA    Hyperlipidemia    Melanoma ([facility])    Prediabetes     Past Surgical History:  Procedure Laterality Date   bone spur removal Left 05/2021   foot   MELANOMA EXCISION     PROSTATE BIOPSY  2022   negative    Prior to Admission medications   Medication Sig Start Date End Date Taking? Authorizing Provider  B Complex-C (B COMPLEX-VITAMIN C) CAPS Take 1 capsule by mouth daily.   Yes [provider]  Biotin (BIOTIN MAXIMUM STRENGTH) 10 MG TABS Take 1 tablet by mouth daily.   Yes [provider]  buPROPion (WELLBUTRIN XL) 150 MG 24 hr tablet Take 150 mg by mouth every morning. 09/28/23  Yes [provider]  Cholecalciferol 125 MCG (5000 UT) TABS Take 1 tablet by mouth daily.   Yes [provider]  finasteride (PROSCAR) 5 MG tablet Take 5 mg by mouth daily.   Yes [provider]  lisinopril (ZESTRIL) 5 MG tablet Take 5 mg by mouth daily.   Yes [provider]  Multiple Vitamin (MULTIVITAMIN) tablet Take 1 tablet by mouth daily.   Yes [provider]  rosuvastatin (CRESTOR) 5 MG tablet Take 5 mg by mouth daily. 08/22/23  Yes [provider]  valsartan (DIOVAN) 80 MG tablet Take 80 mg by mouth daily.  09/28/23  Yes [provider]    Current Outpatient Medications  Medication Sig Dispense Refill   B Complex-C (B COMPLEX-VITAMIN C) CAPS Take 1 capsule by mouth daily.     Biotin (BIOTIN MAXIMUM STRENGTH) 10 MG TABS Take 1 tablet by mouth daily.     buPROPion (WELLBUTRIN XL) 150 MG 24 hr tablet Take 150 mg by mouth every morning.     Cholecalciferol 125 MCG (5000 UT) TABS Take 1 tablet by mouth daily.     finasteride (PROSCAR) 5 MG tablet Take 5 mg by mouth daily.     lisinopril (ZESTRIL) 5 MG tablet Take 5 mg by mouth daily.     Multiple Vitamin (MULTIVITAMIN) tablet Take 1 tablet by mouth daily.     rosuvastatin (CRESTOR) 5 MG tablet Take 5 mg by mouth daily.     valsartan (DIOVAN) 80 MG tablet Take 80 mg by mouth daily.     Current Facility-Administered Medications  Medication Dose Route Frequency Provider Last Rate Last Admin   0.9 %  sodium chloride  infusion  500 mL Intravenous Continuous Stacia Glendia BRAVO, MD        Allergies as of 10/28/2023   (No Known Allergies)    Family History  Problem Relation Age of Onset   Colon cancer Brother    Cancer - Colon Brother  Rectal cancer Neg Hx    Stomach cancer Neg Hx    Esophageal cancer Neg Hx     Social History   Socioeconomic History   Marital status: Divorced    Spouse name: Not on file   Number of children: Not on file   Years of education: Not on file   Highest education level: Not on file  Occupational History   Not on file  Tobacco Use   Smoking status: Never   Smokeless tobacco: Never  Vaping Use   Vaping status: Never Used  Substance and Sexual Activity   Alcohol  use: Not Currently   Drug use: Never   Sexual activity: Not Currently  Other Topics Concern   Not on file  Social History Narrative   Pt lives alone   Social Drivers of Health   Financial Resource Strain: Low Risk  (08/31/2021)   Overall Financial Resource Strain (CARDIA)    Difficulty of Paying Living Expenses: Not hard at all   Food Insecurity: No Food Insecurity (08/31/2021)   Hunger Vital Sign    Worried About Running Out of Food in the Last Year: Never true    Ran Out of Food in the Last Year: Never true  Transportation Needs: No Transportation Needs (08/31/2021)   PRAPARE - Administrator, Civil Service (Medical): No    Lack of Transportation (Non-Medical): No  Physical Activity: Sufficiently Active (08/31/2021)   Exercise Vital Sign    Days of Exercise per Week: 7 days    Minutes of Exercise per Session: 90 min  Stress: No Stress Concern Present (08/31/2021)   [employer] of Occupational Health - Occupational Stress Questionnaire    Feeling of Stress : Not at all  Social Connections: Socially Isolated (08/31/2021)   Social Connection and Isolation Panel    Frequency of Communication with Friends and Family: More than three times a week    Frequency of Social Gatherings with Friends and Family: Twice a week    Attends Religious Services: Never    Database administrator or Organizations: No    Attends Banker Meetings: Never    Marital Status: Divorced  Catering manager Violence: Not At Risk (08/31/2021)   Humiliation, Afraid, Rape, and Kick questionnaire    Fear of Current or Ex-Partner: No    Emotionally Abused: No    Physically Abused: No    Sexually Abused: No    Review of Systems:  All other review of systems negative except as mentioned in the HPI.  Physical Exam: Vital signs Pulse 67   Resp 15   SpO2 99%   General:   Alert,  Well-developed, well-nourished, pleasant and cooperative in NAD Airway:  Mallampati 1 Lungs:  Clear throughout to auscultation.   Heart:  Regular rate and rhythm; no murmurs, clicks, rubs,  or gallops. Abdomen:  Soft, nontender and nondistended. Normal bowel sounds.   Neuro/Psych:  Normal mood and affect. A and O x 3   Shellie Rogoff E. Stacia, MD [facility]

## 2023-10-28 NOTE — Op Note (Signed)
 [facility] Patient Name: Joel Stevens Procedure Date: 10/28/2023 11:39 AM MRN: [id number] Endoscopist: Glendia E. Stacia , MD, [id number] Age: 73 Referring MD:  Date of Birth: January 01, 1951 Gender: Male Account #: [id number] Procedure:                Colonoscopy Indications:              High risk colon cancer surveillance: Personal                            history of multiple (3 or more) adenomas Medicines:                Monitored Anesthesia Care Procedure:                Pre-Anesthesia Assessment:                           - Prior to the procedure, a History and Physical                            was performed, and patient medications and                            allergies were reviewed. The patient's tolerance of                            previous anesthesia was also reviewed. The risks                            and benefits of the procedure and the sedation                            options and risks were discussed with the patient.                            All questions were answered, and informed consent                            was obtained. Prior Anticoagulants: The patient has                            taken no anticoagulant or antiplatelet agents. ASA                            Grade Assessment: II - A patient with mild systemic                            disease. After reviewing the risks and benefits,                            the patient was deemed in satisfactory condition to                            undergo the procedure.  After obtaining informed consent, the colonoscope                            was passed under direct vision. Throughout the                            procedure, the patient's blood pressure, pulse, and                            oxygen saturations were monitored continuously. The                            CF [id number] #[id number] was introduced through the anus                            and  advanced to the the cecum, identified by                            appendiceal orifice and ileocecal valve. The                            colonoscopy was somewhat difficult due to a                            redundant colon and significant looping. Successful                            completion of the procedure was aided by using                            manual pressure. The patient tolerated the                            procedure well. The quality of the bowel                            preparation was adequate. The ileocecal valve,                            appendiceal orifice, and rectum were photographed.                            The bowel preparation used was SUPREP via split                            dose instruction. Scope In: 11:54:11 AM Scope Out: 12:16:44 PM Total Procedure Duration: 0 hours 22 minutes 33 seconds  Findings:                 The perianal and digital rectal examinations were                            normal. Pertinent negatives include normal  sphincter tone and no palpable rectal lesions.                           A 4 mm polyp was found in the ascending colon. The                            polyp was sessile. The polyp was removed with a                            cold snare. Resection and retrieval were complete.                            Estimated blood loss was minimal.                           A 6 mm polyp was found in the transverse colon. The                            polyp was sessile. The polyp was removed with a                            cold snare. Resection and retrieval were complete.                            Estimated blood loss was minimal.                           A 3 mm polyp was found in the distal rectum. The                            polyp was sessile. The polyp was removed with a                            cold snare. Resection and retrieval were complete.                            Estimated  blood loss was minimal.                           A few small-mouthed diverticula were found in the                            descending colon and transverse colon.                           The exam was otherwise normal throughout the                            examined colon.                           The retroflexed view of the distal rectum and anal  verge was normal and showed no anal or rectal                            abnormalities. Complications:            No immediate complications. Estimated Blood Loss:     Estimated blood loss was minimal. Impression:               - One 4 mm polyp in the ascending colon, removed                            with a cold snare. Resected and retrieved.                           - One 6 mm polyp in the transverse colon, removed                            with a cold snare. Resected and retrieved.                           - One 3 mm polyp in the distal rectum, removed with                            a cold snare. Resected and retrieved.                           - Mild diverticulosis in the descending colon and                            in the transverse colon.                           - The distal rectum and anal verge are normal on                            retroflexion view. Recommendation:           - Patient has a contact number available for                            emergencies. The signs and symptoms of potential                            delayed complications were discussed with the                            patient. Return to normal activities tomorrow.                            Written discharge instructions were provided to the                            patient.                           -  Resume previous diet.                           - Continue present medications.                           - Await pathology results.                           - Repeat colonoscopy (date not yet determined) for                             surveillance based on pathology results. Siris Hoos E. Stacia, MD 10/28/2023 12:22:32 PM This report has been signed electronically.

## 2023-10-28 NOTE — Progress Notes (Signed)
 Pt's states no medical or surgical changes since previsit or office visit.

## 2023-10-28 NOTE — Progress Notes (Signed)
 Called to room to assist during endoscopic procedure.  Patient ID and intended procedure confirmed with present staff. Received instructions for my participation in the procedure from the performing physician.

## 2023-10-28 NOTE — Patient Instructions (Addendum)
 Resume previous diet Continue present medications Await pathology results Repeat colonoscopy for surveillance based on results See handouts for diverticulosis and polyps   YOU HAD AN ENDOSCOPIC PROCEDURE TODAY AT THE [facility]:   Refer to the procedure report that was given to you for any specific questions about what was found during the examination.  If the procedure report does not answer your questions, please call your gastroenterologist to clarify.  If you requested that your care partner not be given the details of your procedure findings, then the procedure report has been included in a sealed envelope for you to review at your convenience later.  YOU SHOULD EXPECT: Some feelings of bloating in the abdomen. Passage of more gas than usual.  Walking can help get rid of the air that was put into your GI tract during the procedure and reduce the bloating. If you had a lower endoscopy (such as a colonoscopy or flexible sigmoidoscopy) you may notice spotting of blood in your stool or on the toilet paper. If you underwent a bowel prep for your procedure, you may not have a normal bowel movement for a few days.  Please Note:  You might notice some irritation and congestion in your nose or some drainage.  This is from the oxygen used during your procedure.  There is no need for concern and it should clear up in a day or so.  SYMPTOMS TO REPORT IMMEDIATELY:  Following lower endoscopy (colonoscopy or flexible sigmoidoscopy):  Excessive amounts of blood in the stool  Significant tenderness or worsening of abdominal pains  Swelling of the abdomen that is new, acute  Fever of 100F or higher  For urgent or emergent issues, a gastroenterologist can be reached at any hour by calling [phone]. Do not use MyChart messaging for urgent concerns.   DIET:  We do recommend a small meal at first, but then you may proceed to your regular diet.  Drink plenty of fluids but you should avoid  alcoholic beverages for 24 hours.  ACTIVITY:  You should plan to take it easy for the rest of today and you should NOT DRIVE or use heavy machinery until tomorrow (because of the sedation medicines used during the test).    FOLLOW UP: Our staff will call the number listed on your records the next business day following your procedure.  We will call around 7:15- 8:00 am to check on you and address any questions or concerns that you may have regarding the information given to you following your procedure. If we do not reach you, we will leave a message.     If any biopsies were taken you will be contacted by phone or by letter within the next 1-3 weeks.  Please call us  at [phone] if you have not heard about the biopsies in 3 weeks.   SIGNATURES/CONFIDENTIALITY: You and/or your care partner have signed paperwork which will be entered into your electronic medical record.  These signatures attest to the fact that that the information above on your After Visit Summary has been reviewed and is understood.  Full responsibility of the confidentiality of this discharge information lies with you and/or your care-partner.

## 2023-10-29 ENCOUNTER — Telehealth: Payer: Self-pay

## 2023-10-29 NOTE — Telephone Encounter (Signed)
  Follow up Call-     10/28/2023   10:39 AM  Call back number  Post procedure Call Back phone  # [phone]  Permission to leave phone message Yes     Patient questions:  Do you have a fever, pain , or abdominal swelling? No. Pain Score  0 *  Have you tolerated food without any problems? Yes.    Have you been able to return to your normal activities? Yes.    Do you have any questions about your discharge instructions: Diet   No. Medications  No. Follow up visit  No.  Do you have questions or concerns about your Care? No.  Actions: * If pain score is 4 or above: No action needed, pain <4.

## 2023-10-30 LAB — SURGICAL PATHOLOGY

## 2023-10-31 DIAGNOSIS — K635 Polyp of colon: Secondary | ICD-10-CM | POA: Diagnosis not present

## 2023-10-31 DIAGNOSIS — S0500XA Injury of conjunctiva and corneal abrasion without foreign body, unspecified eye, initial encounter: Secondary | ICD-10-CM | POA: Diagnosis not present

## 2023-10-31 DIAGNOSIS — H539 Unspecified visual disturbance: Secondary | ICD-10-CM | POA: Diagnosis not present

## 2023-10-31 DIAGNOSIS — I10 Essential (primary) hypertension: Secondary | ICD-10-CM | POA: Diagnosis not present

## 2023-11-04 ENCOUNTER — Ambulatory Visit: Payer: Self-pay | Admitting: Gastroenterology

## 2023-11-04 DIAGNOSIS — I10 Essential (primary) hypertension: Secondary | ICD-10-CM | POA: Diagnosis not present

## 2023-11-04 DIAGNOSIS — H15009 Unspecified scleritis, unspecified eye: Secondary | ICD-10-CM | POA: Diagnosis not present

## 2023-11-04 NOTE — Progress Notes (Signed)
 Joel Stevens,   The three polyps that I removed during your recent procedure were completely benign but were proven to be pre-cancerous polyps that MAY have grown into cancers if they had not been removed.  Studies shows that at least 20% of women over age 73 and 30% of men over age 73 have pre-cancerous polyps.  Based on current nationally recognized surveillance guidelines, I recommend that you have a repeat colonoscopy in 3-4 years.   If you develop any new rectal bleeding, abdominal pain or significant bowel habit changes, please contact me before then.

## 2023-11-05 DIAGNOSIS — H21541 Posterior synechiae (iris), right eye: Secondary | ICD-10-CM | POA: Diagnosis not present

## 2023-11-05 DIAGNOSIS — H2000 Unspecified acute and subacute iridocyclitis: Secondary | ICD-10-CM | POA: Diagnosis not present

## 2023-11-07 DIAGNOSIS — H2513 Age-related nuclear cataract, bilateral: Secondary | ICD-10-CM | POA: Diagnosis not present

## 2023-11-07 DIAGNOSIS — H21541 Posterior synechiae (iris), right eye: Secondary | ICD-10-CM | POA: Diagnosis not present

## 2023-11-07 DIAGNOSIS — H2000 Unspecified acute and subacute iridocyclitis: Secondary | ICD-10-CM | POA: Diagnosis not present

## 2023-11-12 DIAGNOSIS — F411 Generalized anxiety disorder: Secondary | ICD-10-CM | POA: Diagnosis not present

## 2023-11-12 DIAGNOSIS — G4700 Insomnia, unspecified: Secondary | ICD-10-CM | POA: Diagnosis not present

## 2023-11-12 DIAGNOSIS — R739 Hyperglycemia, unspecified: Secondary | ICD-10-CM | POA: Diagnosis not present

## 2023-11-12 DIAGNOSIS — C439 Malignant melanoma of skin, unspecified: Secondary | ICD-10-CM | POA: Diagnosis not present

## 2023-11-12 DIAGNOSIS — H109 Unspecified conjunctivitis: Secondary | ICD-10-CM | POA: Diagnosis not present

## 2023-11-12 DIAGNOSIS — I10 Essential (primary) hypertension: Secondary | ICD-10-CM | POA: Diagnosis not present

## 2023-11-12 DIAGNOSIS — H209 Unspecified iridocyclitis: Secondary | ICD-10-CM | POA: Diagnosis not present

## 2023-11-12 DIAGNOSIS — R5383 Other fatigue: Secondary | ICD-10-CM | POA: Diagnosis not present

## 2023-11-12 DIAGNOSIS — E785 Hyperlipidemia, unspecified: Secondary | ICD-10-CM | POA: Diagnosis not present

## 2023-11-12 DIAGNOSIS — F331 Major depressive disorder, recurrent, moderate: Secondary | ICD-10-CM | POA: Diagnosis not present

## 2023-11-13 DIAGNOSIS — H21541 Posterior synechiae (iris), right eye: Secondary | ICD-10-CM | POA: Diagnosis not present

## 2023-11-13 DIAGNOSIS — H2513 Age-related nuclear cataract, bilateral: Secondary | ICD-10-CM | POA: Diagnosis not present

## 2023-11-13 DIAGNOSIS — H2000 Unspecified acute and subacute iridocyclitis: Secondary | ICD-10-CM | POA: Diagnosis not present

## 2023-11-15 DIAGNOSIS — I10 Essential (primary) hypertension: Secondary | ICD-10-CM | POA: Diagnosis not present

## 2023-11-15 DIAGNOSIS — Z0000 Encounter for general adult medical examination without abnormal findings: Secondary | ICD-10-CM | POA: Diagnosis not present

## 2023-11-15 DIAGNOSIS — R7303 Prediabetes: Secondary | ICD-10-CM | POA: Diagnosis not present

## 2023-11-27 DIAGNOSIS — R972 Elevated prostate specific antigen [PSA]: Secondary | ICD-10-CM | POA: Diagnosis not present

## 2023-11-27 DIAGNOSIS — R0683 Snoring: Secondary | ICD-10-CM | POA: Diagnosis not present

## 2023-11-27 DIAGNOSIS — R7303 Prediabetes: Secondary | ICD-10-CM | POA: Diagnosis not present

## 2023-11-27 DIAGNOSIS — Z79899 Other long term (current) drug therapy: Secondary | ICD-10-CM | POA: Diagnosis not present

## 2023-11-27 DIAGNOSIS — Z713 Dietary counseling and surveillance: Secondary | ICD-10-CM | POA: Diagnosis not present

## 2023-11-27 DIAGNOSIS — Z789 Other specified health status: Secondary | ICD-10-CM | POA: Diagnosis not present

## 2023-11-27 DIAGNOSIS — Z6836 Body mass index (BMI) 36.0-36.9, adult: Secondary | ICD-10-CM | POA: Diagnosis not present

## 2023-11-27 DIAGNOSIS — E782 Mixed hyperlipidemia: Secondary | ICD-10-CM | POA: Diagnosis not present

## 2023-11-27 DIAGNOSIS — E781 Pure hyperglyceridemia: Secondary | ICD-10-CM | POA: Diagnosis not present

## 2023-11-27 DIAGNOSIS — N400 Enlarged prostate without lower urinary tract symptoms: Secondary | ICD-10-CM | POA: Diagnosis not present

## 2023-11-27 DIAGNOSIS — I10 Essential (primary) hypertension: Secondary | ICD-10-CM | POA: Diagnosis not present

## 2023-11-28 DIAGNOSIS — E1165 Type 2 diabetes mellitus with hyperglycemia: Secondary | ICD-10-CM | POA: Diagnosis not present

## 2023-11-28 DIAGNOSIS — Z23 Encounter for immunization: Secondary | ICD-10-CM | POA: Diagnosis not present

## 2023-12-03 DIAGNOSIS — E669 Obesity, unspecified: Secondary | ICD-10-CM | POA: Diagnosis not present

## 2023-12-03 DIAGNOSIS — Z008 Encounter for other general examination: Secondary | ICD-10-CM | POA: Diagnosis not present

## 2023-12-03 DIAGNOSIS — Z6832 Body mass index (BMI) 32.0-32.9, adult: Secondary | ICD-10-CM | POA: Diagnosis not present

## 2023-12-03 DIAGNOSIS — R7303 Prediabetes: Secondary | ICD-10-CM | POA: Diagnosis not present

## 2023-12-11 DIAGNOSIS — H21541 Posterior synechiae (iris), right eye: Secondary | ICD-10-CM | POA: Diagnosis not present

## 2023-12-11 DIAGNOSIS — H2000 Unspecified acute and subacute iridocyclitis: Secondary | ICD-10-CM | POA: Diagnosis not present

## 2023-12-11 DIAGNOSIS — H2513 Age-related nuclear cataract, bilateral: Secondary | ICD-10-CM | POA: Diagnosis not present

## 2023-12-18 DIAGNOSIS — Z713 Dietary counseling and surveillance: Secondary | ICD-10-CM | POA: Diagnosis not present

## 2023-12-18 DIAGNOSIS — R7303 Prediabetes: Secondary | ICD-10-CM | POA: Diagnosis not present

## 2023-12-18 DIAGNOSIS — E669 Obesity, unspecified: Secondary | ICD-10-CM | POA: Diagnosis not present

## 2024-01-06 DIAGNOSIS — L570 Actinic keratosis: Secondary | ICD-10-CM | POA: Diagnosis not present

## 2024-01-14 ENCOUNTER — Encounter: Payer: Self-pay | Admitting: *Deleted

## 2024-01-14 NOTE — Progress Notes (Signed)
 Joel Stevens                                          MRN: [id number]   01/14/2024   The [facility] Quality Team Specialist reviewed this patient medical record for the purposes of chart review for care gap closure. The following were reviewed: chart review for care gap closure-kidney health evaluation for diabetes:eGFR  and uACR.    [facility] Quality Team

## 2024-04-24 NOTE — Progress Notes (Signed)
 Joel Stevens                                          MRN: [id number]   04/24/2024   The [facility] Quality Team Specialist reviewed this patient medical record for the purposes of chart review for care gap closure. The following were reviewed: chart review for care gap closure-diabetic eye exam.    [facility] Quality Team
